# Patient Record
Sex: Female | Born: 1935 | Race: White | Hispanic: No | Smoking: Never smoker
Health system: Southern US, Community
[De-identification: ages and names within clinical notes are randomized; demographics above are authoritative.]

## PROBLEM LIST (undated history)

## (undated) DIAGNOSIS — M1990 Unspecified osteoarthritis, unspecified site: Secondary | ICD-10-CM

## (undated) DIAGNOSIS — K219 Gastro-esophageal reflux disease without esophagitis: Secondary | ICD-10-CM

## (undated) DIAGNOSIS — I509 Heart failure, unspecified: Secondary | ICD-10-CM

## (undated) DIAGNOSIS — I351 Nonrheumatic aortic (valve) insufficiency: Secondary | ICD-10-CM

## (undated) DIAGNOSIS — R29898 Other symptoms and signs involving the musculoskeletal system: Secondary | ICD-10-CM

## (undated) DIAGNOSIS — I10 Essential (primary) hypertension: Secondary | ICD-10-CM

## (undated) DIAGNOSIS — F32A Depression, unspecified: Secondary | ICD-10-CM

## (undated) DIAGNOSIS — D649 Anemia, unspecified: Secondary | ICD-10-CM

## (undated) DIAGNOSIS — F419 Anxiety disorder, unspecified: Secondary | ICD-10-CM

## (undated) DIAGNOSIS — F0150 Vascular dementia without behavioral disturbance: Secondary | ICD-10-CM

## (undated) DIAGNOSIS — I5021 Acute systolic (congestive) heart failure: Secondary | ICD-10-CM

## (undated) DIAGNOSIS — R0902 Hypoxemia: Secondary | ICD-10-CM

## (undated) DIAGNOSIS — F329 Major depressive disorder, single episode, unspecified: Secondary | ICD-10-CM

## (undated) DIAGNOSIS — G459 Transient cerebral ischemic attack, unspecified: Secondary | ICD-10-CM

## (undated) DIAGNOSIS — Z8719 Personal history of other diseases of the digestive system: Secondary | ICD-10-CM

## (undated) DIAGNOSIS — R410 Disorientation, unspecified: Secondary | ICD-10-CM

## (undated) HISTORY — DX: Essential (primary) hypertension: I10

## (undated) HISTORY — DX: Disorientation, unspecified: R41.0

## (undated) HISTORY — DX: Anxiety disorder, unspecified: F41.9

## (undated) HISTORY — DX: Hypoxemia: R09.02

## (undated) HISTORY — DX: Acute systolic (congestive) heart failure: I50.21

## (undated) HISTORY — DX: Depression, unspecified: F32.A

## (undated) HISTORY — DX: Major depressive disorder, single episode, unspecified: F32.9

## (undated) HISTORY — DX: Other symptoms and signs involving the musculoskeletal system: R29.898

## (undated) HISTORY — DX: Anemia, unspecified: D64.9

## (undated) HISTORY — DX: Unspecified osteoarthritis, unspecified site: M19.90

## (undated) HISTORY — DX: Transient cerebral ischemic attack, unspecified: G45.9

## (undated) HISTORY — DX: Nonrheumatic aortic (valve) insufficiency: I35.1

---

## 1960-07-04 HISTORY — PX: TUBAL LIGATION: SHX77

## 1979-03-05 HISTORY — PX: CARPAL TUNNEL RELEASE: SHX101

## 1989-03-04 HISTORY — PX: CHOLECYSTECTOMY: SHX55

## 1989-03-04 HISTORY — PX: CATARACT EXTRACTION W/ INTRAOCULAR LENS  IMPLANT, BILATERAL: SHX1307

## 1999-07-05 HISTORY — PX: TOTAL KNEE ARTHROPLASTY: SHX125

## 1999-12-01 ENCOUNTER — Encounter: Payer: Self-pay | Admitting: Orthopedic Surgery

## 1999-12-02 ENCOUNTER — Inpatient Hospital Stay (HOSPITAL_COMMUNITY): Admission: RE | Admit: 1999-12-02 | Discharge: 1999-12-06 | Payer: Self-pay | Admitting: Orthopedic Surgery

## 1999-12-06 ENCOUNTER — Inpatient Hospital Stay (HOSPITAL_COMMUNITY)
Admission: RE | Admit: 1999-12-06 | Discharge: 1999-12-14 | Payer: Self-pay | Admitting: Physical Medicine & Rehabilitation

## 2000-05-26 ENCOUNTER — Inpatient Hospital Stay (HOSPITAL_COMMUNITY): Admission: EM | Admit: 2000-05-26 | Discharge: 2000-05-30 | Payer: Self-pay | Admitting: Emergency Medicine

## 2000-05-26 ENCOUNTER — Encounter: Payer: Self-pay | Admitting: Emergency Medicine

## 2000-05-27 ENCOUNTER — Encounter: Payer: Self-pay | Admitting: Internal Medicine

## 2002-10-18 ENCOUNTER — Ambulatory Visit (HOSPITAL_COMMUNITY): Admission: RE | Admit: 2002-10-18 | Discharge: 2002-10-18 | Payer: Self-pay | Admitting: Gastroenterology

## 2002-10-22 ENCOUNTER — Encounter: Payer: Self-pay | Admitting: Gastroenterology

## 2002-10-22 ENCOUNTER — Ambulatory Visit (HOSPITAL_COMMUNITY): Admission: RE | Admit: 2002-10-22 | Discharge: 2002-10-22 | Payer: Self-pay | Admitting: Gastroenterology

## 2006-05-30 ENCOUNTER — Encounter: Admission: RE | Admit: 2006-05-30 | Discharge: 2006-05-30 | Payer: Self-pay | Admitting: Internal Medicine

## 2007-06-08 ENCOUNTER — Encounter: Admission: RE | Admit: 2007-06-08 | Discharge: 2007-07-04 | Payer: Self-pay | Admitting: Neurology

## 2008-09-20 ENCOUNTER — Inpatient Hospital Stay (HOSPITAL_COMMUNITY): Admission: EM | Admit: 2008-09-20 | Discharge: 2008-09-26 | Payer: Self-pay | Admitting: Emergency Medicine

## 2008-11-21 ENCOUNTER — Encounter: Admission: RE | Admit: 2008-11-21 | Discharge: 2008-11-21 | Payer: Self-pay | Admitting: Internal Medicine

## 2009-04-28 ENCOUNTER — Encounter: Admission: RE | Admit: 2009-04-28 | Discharge: 2009-04-28 | Payer: Self-pay | Admitting: Internal Medicine

## 2010-07-04 DIAGNOSIS — I509 Heart failure, unspecified: Secondary | ICD-10-CM

## 2010-07-04 HISTORY — DX: Heart failure, unspecified: I50.9

## 2010-08-11 ENCOUNTER — Inpatient Hospital Stay (HOSPITAL_COMMUNITY): Payer: Medicare Other

## 2010-08-11 ENCOUNTER — Inpatient Hospital Stay (HOSPITAL_COMMUNITY)
Admission: AD | Admit: 2010-08-11 | Discharge: 2010-08-14 | DRG: 293 | Disposition: A | Discharge status: Home Health | Payer: Medicare Other | Source: Ambulatory Visit | Attending: Internal Medicine | Admitting: Internal Medicine

## 2010-08-11 ENCOUNTER — Encounter: Payer: Self-pay | Admitting: Internal Medicine

## 2010-08-11 DIAGNOSIS — Z7982 Long term (current) use of aspirin: Secondary | ICD-10-CM

## 2010-08-11 DIAGNOSIS — R404 Transient alteration of awareness: Secondary | ICD-10-CM | POA: Diagnosis present

## 2010-08-11 DIAGNOSIS — I319 Disease of pericardium, unspecified: Secondary | ICD-10-CM

## 2010-08-11 DIAGNOSIS — F341 Dysthymic disorder: Secondary | ICD-10-CM | POA: Diagnosis present

## 2010-08-11 DIAGNOSIS — I10 Essential (primary) hypertension: Secondary | ICD-10-CM | POA: Diagnosis present

## 2010-08-11 DIAGNOSIS — Z66 Do not resuscitate: Secondary | ICD-10-CM | POA: Diagnosis present

## 2010-08-11 DIAGNOSIS — R0902 Hypoxemia: Secondary | ICD-10-CM | POA: Diagnosis present

## 2010-08-11 DIAGNOSIS — I509 Heart failure, unspecified: Secondary | ICD-10-CM | POA: Diagnosis present

## 2010-08-11 DIAGNOSIS — F068 Other specified mental disorders due to known physiological condition: Secondary | ICD-10-CM | POA: Diagnosis present

## 2010-08-11 DIAGNOSIS — Z8673 Personal history of transient ischemic attack (TIA), and cerebral infarction without residual deficits: Secondary | ICD-10-CM

## 2010-08-11 DIAGNOSIS — I359 Nonrheumatic aortic valve disorder, unspecified: Secondary | ICD-10-CM | POA: Diagnosis present

## 2010-08-11 DIAGNOSIS — R5381 Other malaise: Secondary | ICD-10-CM | POA: Diagnosis present

## 2010-08-11 DIAGNOSIS — I5023 Acute on chronic systolic (congestive) heart failure: Principal | ICD-10-CM | POA: Diagnosis present

## 2010-08-11 LAB — CBC
HCT: 38 % (ref 36.0–46.0)
MCV: 88.4 fL (ref 78.0–100.0)
Platelets: 284 10*3/uL (ref 150–400)
RBC: 4.3 MIL/uL (ref 3.87–5.11)
WBC: 7.6 10*3/uL (ref 4.0–10.5)

## 2010-08-11 LAB — COMPREHENSIVE METABOLIC PANEL
ALT: 10 U/L (ref 0–35)
Albumin: 3.3 g/dL — ABNORMAL LOW (ref 3.5–5.2)
Alkaline Phosphatase: 39 U/L (ref 39–117)
BUN: 10 mg/dL (ref 6–23)
Chloride: 104 mEq/L (ref 96–112)
Potassium: 3.6 mEq/L (ref 3.5–5.1)
Total Bilirubin: 0.6 mg/dL (ref 0.3–1.2)

## 2010-08-11 LAB — DIFFERENTIAL
Eosinophils Absolute: 0.1 10*3/uL (ref 0.0–0.7)
Lymphocytes Relative: 14 % (ref 12–46)
Lymphs Abs: 1 10*3/uL (ref 0.7–4.0)
Neutrophils Relative %: 78 % — ABNORMAL HIGH (ref 43–77)

## 2010-08-11 LAB — D-DIMER, QUANTITATIVE: D-Dimer, Quant: 0.48 ug/mL-FEU (ref 0.00–0.48)

## 2010-08-12 DIAGNOSIS — I509 Heart failure, unspecified: Secondary | ICD-10-CM

## 2010-08-12 LAB — CBC
HCT: 38.5 % (ref 36.0–46.0)
MCH: 29.2 pg (ref 26.0–34.0)
MCHC: 33.2 g/dL (ref 30.0–36.0)
MCV: 87.9 fL (ref 78.0–100.0)
RDW: 12.9 % (ref 11.5–15.5)

## 2010-08-12 LAB — BASIC METABOLIC PANEL
BUN: 12 mg/dL (ref 6–23)
Calcium: 8.6 mg/dL (ref 8.4–10.5)
Creatinine, Ser: 0.61 mg/dL (ref 0.4–1.2)
GFR calc non Af Amer: >60 mL/min (ref >60)
Glucose, Bld: 93 mg/dL (ref 70–99)

## 2010-08-12 LAB — T4, FREE: Free T4: 1.16 ng/dL (ref 0.80–1.80)

## 2010-08-13 ENCOUNTER — Inpatient Hospital Stay (HOSPITAL_COMMUNITY): Payer: Medicare Other

## 2010-08-13 LAB — CBC
HCT: 39.7 % (ref 36.0–46.0)
MCV: 87.6 fL (ref 78.0–100.0)
RBC: 4.53 MIL/uL (ref 3.87–5.11)
WBC: 9.9 10*3/uL (ref 4.0–10.5)

## 2010-08-13 LAB — BASIC METABOLIC PANEL
BUN: 13 mg/dL (ref 6–23)
Chloride: 103 mEq/L (ref 96–112)
Glucose, Bld: 106 mg/dL — ABNORMAL HIGH (ref 70–99)
Potassium: 3.6 mEq/L (ref 3.5–5.1)
Sodium: 142 mEq/L (ref 135–145)

## 2010-08-13 LAB — MAGNESIUM: Magnesium: 2 mg/dL (ref 1.5–2.5)

## 2010-08-14 LAB — BRAIN NATRIURETIC PEPTIDE: Pro B Natriuretic peptide (BNP): 330 pg/mL — ABNORMAL HIGH (ref 0.0–100.0)

## 2010-08-14 LAB — BASIC METABOLIC PANEL
CO2: 31 mEq/L (ref 19–32)
GFR calc Af Amer: >60 mL/min (ref >60)
GFR calc non Af Amer: >60 mL/min (ref >60)
Glucose, Bld: 97 mg/dL (ref 70–99)
Potassium: 3.3 mEq/L — ABNORMAL LOW (ref 3.5–5.1)
Sodium: 144 mEq/L (ref 135–145)

## 2010-08-14 LAB — PHOSPHORUS: Phosphorus: 4.5 mg/dL (ref 2.3–4.6)

## 2010-08-15 NOTE — H&P (Signed)
[name], [name]                   ACCOUNT NO.:  [id number]  MEDICAL RECORD NO.:  [id number]           PATIENT TYPE:  I  LOCATION:  4728                         FACILITY:  [facility]  PHYSICIAN:  Andreas Blower, MD       DATE OF BIRTH:  09-04-1925  DATE OF ADMISSION:  08/11/2010 DATE OF DISCHARGE:                             HISTORY & PHYSICAL   PRIMARY CARE PHYSICIAN:  Erskine Speed, M.D.  CHIEF COMPLAINT:  Shortness of breath.  HISTORY OF PRESENT ILLNESS:  [name] is an 77 year old Caucasian female with history of hypertension, dementia, anxiety, depression, TIA's, deconditioning, hiatal hernia who presents with the above complaints. Given the patient's significant dementia, most of the history was provided by the patient's family and caretaker.  The patient has had intermittent episodes of shortness of breath.  However, was worse over the weekend on February 4 and 5th of 2012.  About a week ago, they had gone to her primary care physician who again noted that the patient was not acting her usual self.  The patient had gone back to her primary care physician for a followup appointment today and it was noted that she continued to be short of breath and continued to have confusion with new grade 2/6 systolic murmur.  As the result, her primary care physician had the patient directly admitted for further evaluation.  Per the patient's caretaker, has not had any recent fevers, but does feel chills all the time.  The patient does not complain of any chest pain or shortness of breath presently, does not complain of any abdominal pain. Does have loose stools which are normal for her.  Does not complain of any headaches or vision changes.  She also with the shortness of breath has been having cough which has been nonproductive.  REVIEW OF SYSTEMS:  All systems were reviewed with the patient, was positive as per HPI.  Otherwise all other systems are negative.  Limited due to the patient's  dementia.  SOCIAL HISTORY:  The patient does not smoke, does not drink any alcohol. Lives at home with the daughter.  Caretaker looks after her during the days.  FAMILY HISTORY:  The patient is not able to provide any family history and the family members that are present are not able to provide any family history except the patient's parents and siblings lived for a long time and died of natural causes.  HOME MEDICATIONS: 1. Acetaminophen 1000 mg p.o. daily at bedtime. 2. Bupropion sustained release 150 mg p.o. daily. 3. Donepezil 20 mg p.o. daily at bedtime. 4. Tramadol 25 mg p.o. q.a.m. 5. Omeprazole 20 mg p.o. q.a.m. 6. Fluoxetine 40 mg p.o. daily. 7. Clonazepam 0.25 mg p.o. daily 8. Metoprolol extended release 12.5 mg p.o. daily.  PHYSICAL EXAMINATION:  VITAL SIGNS:  Temperature 97.4, pulse is 72, respirations 16, blood pressure is 164/72, saturating at 90%. GENERAL:  The patient was awake, was oriented to self, was lying in bed comfortably.  Not appeared to be in any acute distress.  Was breathing comfortably on room air, was able to talk to me  in complete sentences without getting short of breath. HEENT:  Extraocular motions are intact.  Pupils are equal and round. Had moist mucous membranes. NECK:  Supple. HEART:  Regular with S1 and S2.  Had a grade 2/6 systolic murmur at the left sternal border. ABDOMEN:  Soft, nontender, nondistended.  Positive bowel sounds. EXTREMITIES:  The patient had good peripheral pulses with trace edema. NEUROLOGIC:  Cranial nerves II through XII are grossly intact.  Had 5/5 motor strength in upper as well as lower extremities.  RADIOLOGY/IMAGING:  The patient had chest x-ray two-view which showed bibasilar pulmonary infiltrates which could represent pulmonary edema. Huge hiatal hernia.  LABORATORY DATA:  CBC shows a white count of 7.6, hemoglobin 12.5, hematocrit 38.0, platelet count 284.  Electrolytes normal with a creatinine of 0.57.   Liver function tests normal except albumin is 3.3.  ASSESSMENT AND PLAN: 1. Shortness of breath, multifactorial.  The patient has a pulmonary     edema noted on chest x-ray with deconditioning.  A 2-D     echocardiogram is currently being done, the results are pending.     Given that the patient does have pulmonary edema, we have started     the patient on gentle diuresis on Lasix 20 mg IV x2 doses today,     then twice daily starting tomorrow.  Further management after the 2-     D echocardiogram results are available.  D-dimer has been ordered     and pending, if positive will need a CT of the chest with contrast     to evaluate for pulmonary embolism.  Given that the patient has     infiltrates which may be due to pulmonary edema, we will also     empirically start the patient on ceftriaxone and azithromycin for     cover for any community-acquired pneumonia.  Would consider getting     a chest x-ray in 1-2 days.  If chest x-ray shows improvement in     pulmonary edema on Lasix without any a fever to suggest pneumonia,     would consider discontinuing antibiotics at that time. 2. Pulmonary edema.  Management as indicated above.  2-D  echocardiogram is pending.  We will check for BNP. 3. Grade 2/6 systolic murmur.  2-D echocardiogram is done, the results     are pending.  Further management after the echocardiogram results     are available. 4. Hypertension.  Continue home medications of metoprolol with hold     parameters. 5. Acute delirium on top of dementia most likely secondary to     shortness of breath, pulmonary edema.  We will observe how the     patient does during the course of hospital stay. 6. History of anxiety and depression.  Continue home medications.  We     will change clonazepam to p.r.n.  I will continue bupropion     sustained release and fluoxetine. 7. History of deconditioning.  We will get PT evaluation. 8. Hypoxia most likely secondary to pulmonary edema.   D-dimer is     pending.  If possible we will CT of the chest with contrast to     evaluate for PE. 9. Prophylaxis.  Lovenox for DVT prophylaxis. 10.Code status.  The patient is full code.  Most of the decisions are     made by the patient's daughter is not available     at this time.  The patient's other family members are uncertain  about the code status issues. Once the patient's daughter is     available, we will need to have that readdressed with the daughter.  Time spent on admission talking to the patient coordinating care was 1 hour.   Andreas Blower, MD   SR/MEDQ  D:  08/11/2010  T:  08/11/2010  Job:  [id number]  cc:   Erskine Speed, M.D.  Electronically Signed by Wardell Heath Nicloe Frontera  on 08/13/2010 08:22:30 PM

## 2010-08-15 NOTE — Discharge Summary (Signed)
[name], HOLLINGSHEAD                   ACCOUNT NO.:  [id number]  MEDICAL RECORD NO.:  [id number]           PATIENT TYPE:  I  LOCATION:  4728                         FACILITY:  [facility]  PHYSICIAN:  Rock Nephew, MD       DATE OF BIRTH:  13-Feb-1926  DATE OF ADMISSION:  08/11/2010 DATE OF DISCHARGE:  08/14/2010                        DISCHARGE SUMMARY - REFERRING   PRIMARY CARE PHYSICIAN:  Erskine Speed, M.D.  DISCHARGE DIAGNOSES: 1. Acute systolic heart failure. 2. Aortic regurgitation. 3. Hypertension. 4. Acute delirium. 5. Dementia. 6. Anxiety. 7. Depression. 8. Deconditioning. 9. Hypoxia, resolved. 10.History of transient ischemic attack.  DISCHARGE MEDICATIONS: 1. K-Dur 20 mEq p.o. daily. 2. Lasix 40 mg p.o. daily. 3. Lisinopril 10 mg p.o. daily. 4. Acetaminophen 500 mg 2 tablets by mouth daily at bedtime. 5. Aricept 10 mg 2 tablets by mouth daily at bedtime. 6. Bupropion SR 150 mg 1 tablet by mouth daily. 7. Clonazepam 0.5 mg 1/2 tablet by mouth daily. 8. Metoprolol XL 25 mg 1/2 tablet by mouth daily. 9. Omeprazole 20 mg 1 tablet by mouth every morning. 10.Prozac 20 mg 2 tablets by mouth daily. 11.Tramadol 50 mg 1/2 tablet by mouth every morning. 12.Aspirin 81 mg p.o. daily.  DISPOSITION:  The patient is discharged home with caregiver as well as family.  DIET:  Heart-healthy 1.5 liters fluid restriction.  CONSULTATIONS ON THIS CASE:  Dr. Duke Salvia, [facility].  PROCEDURES PERFORMED:  The patient had chest x-ray, the last one was on August 13, 2010, which showed improvement in aeration at lung base compared to prior, persistent left lower atelectasis, and/or effusion, cardiomegaly.  The patient also had a 2-D echocardiogram, which showed a left ventricular ejection fraction of 40-45%, moderate hypokinesis of the mid anteroseptal myocardium, aortic valve, moderate regurgitation. Pulmonary artery pressure was increased at 49 mmHg, mild mitral  regurg, small free-flowing pericardial effusion.  FOLLOWUP AGAIN:  The patient should follow up with Dr. Nila Nephew within 1 week.  The patient should follow up with Dr. Sherryl Manges of [facility] in about 2-4 weeks.  BRIEF HISTORY AND PHYSICAL:  This is an 77 year old female with history of multiple medical problems, who came to the hospital because of shortness of breath.  The patient also has a heart murmur, that is newly discovered.  The patient came to the hospital, and the patient had pulmonary vascular congestion on the chest x-ray and an elevated BNP with symptoms consistent of heart failure.  1. Acute systolic heart failure.  The patient was admitted to the     hospital.  The patient was diuresed with IV Lasix.  The patient had     a 2-D echocardiogram done, which showed a left ventricle ejection     fraction of 45% with hypokinesias.  [facility] was consulted and     Cardiology thought the best to manage the patient medically.  The     patient is not a clear candidate for a cardiac catheterization.     Cardiac catheterization was not offered to the patient.  The     patient will be  discharged home on Lasix and an ACE inhibitor. 2. Aortic regurgitation.  The patient has aortic regurgitation, and     she is being monitored. 3. Hypertension.  The patient has a history hypertension.  Now, the     patient's blood pressure has been controlled. 4. Acute delirium and dementia.  The patient's acute delirium and     dementia has resolved.  The patient is at her baseline mental     status.  She takes Aricept at home. 5. Anxiety and depression.  The patient has anxiety and depression.     She takes clonazepam at home and which she will be continued. 6. Deconditioning.  The patient will get a home health PT at home.     The patient also has a caregiver at home which will continue. 7. Hypoxia.  The patient had hypoxia initially.  It was most likely     related to the  patient's systolic congestive heart failure, which     has since resolved. 8. DVT prophylaxis.  The patient is receiving enoxaparin for DVT     prophylaxis during the discharge.  Also of note, we discussed code status with the patient's daughter, and the patient was made DNR during this admission.   Please note that this is not an official document till electronically signed.     Rock Nephew, MD     NH/MEDQ  D:  08/14/2010  T:  08/14/2010  Job:  [id number]  cc:   Erskine Speed, M.D. Duke Salvia, MD, [facility]  Electronically Signed by Rock Nephew MD on 08/15/2010 04:54:09 PM

## 2010-08-15 NOTE — H&P (Signed)
  [name], [name]                   ACCOUNT NO.:  [id number]  MEDICAL RECORD NO.:  [id number]           PATIENT TYPE:  I  LOCATION:  4728                         FACILITY:  [facility]  PHYSICIAN:  Andreas Blower, MD       DATE OF BIRTH:  02/01/1926  DATE OF ADMISSION:  08/11/2010 DATE OF DISCHARGE:  Duplicated H&P dictated in error.  Andreas Blower, MD  SR/MEDQ  D:  08/11/2010  T:  08/12/2010  Job:  [id number] Electronically Signed by Wardell Heath [name]  on 08/13/2010 08:24:04 PM

## 2010-08-23 NOTE — Consult Note (Signed)
NAMETIONNA, GIGANTE                   ACCOUNT NO.:  [id number]  MEDICAL RECORD NO.:  [id number]           PATIENT TYPE:  LOCATION:                                 FACILITY:  PHYSICIAN:  Duke Salvia, MD, FACCDATE OF BIRTH:  [birth year]  DATE OF CONSULTATION:  08/12/2010 DATE OF DISCHARGE:                                CONSULTATION   PRIMARY CARDIOLOGIST:  New patient to [facility], currently being evaluated by Dr. Sherryl Manges.  PRIMARY CARE PROVIDER:  Erskine Speed, MD  REASON FOR CONSULTATION:  Congestive heart failure.  HISTORY OF PRESENT ILLNESS:  This is an 77 year old elderly female who is a poor historian secondary to advanced dementia.  History was provided by the patient's daughter and caretaker.  The patient's daughter states that she noticed the patient becoming weaker and more confused approximately 48 hours prior to admission.  Since that time, she has had increased shortness of breath.  On day of admission, the caretaker went to awake the patient and noted that she was orthopneic with a nonproductive cough.  They brought the patient to her primary care provider where a new murmur was heard.  With the patient's dyspnea, as well as new heart murmur, the patient was admitted to the hospital for further evaluation.    The patient was admitted by the triad hospitalist group and placed on IV diuresis.  The patient's initial weight was 55.4 kg, and she is currently 53.6 kg.  Her BNP was elevated to 862.  Chest x-ray did show probable edema.  A 2-D echocardiogram has since been obtained and showed a slightly decreased ejection fraction of 45-50% with moderate hypokinesis of the mid anterior septal myocardium.  It also showed moderate aortic insufficiency.  Cardiology was asked to evaluate the patient for medication optimization.  The patient currently states that her breathing is fine.  The caretaker and the daughter, Ms. Manson Passey, state that the patient's  shortness of breath has improved.  They deny any complaints of chest pain, edema, fevers, or chills.  The patient is currently hemodynamically stable.  PAST MEDICAL HISTORY: 1. Hypertension. 2. Dementia. 3. Anxiety. 4. Depression. 5. TIA. 6. Gastroesophageal reflux disease. 7. Hiatal hernia. 8. Status post cholecystectomy. 9. Status post left knee surgery.  SOCIAL HISTORY:  The patient lives in [place] with her daughter. While her daughter is at work, she has a caretaker that looks after her. The patient denies any tobacco, alcohol, or illicit drug use.  FAMILY HISTORY:  Noncontributory for early coronary artery disease.  The patient states that her family lived long and died of old age.  ALLERGIES: 1. DILANTIN. 2. TEGRETOL.  MEDICATIONS: 1. Aspirin 81 mg daily. 2. Wellbutrin 150 mg daily. 3. Aricept 20 mg daily. 4. Lovenox 40 mg subcutaneously daily. 5. Prozac 40 mg daily. 6. Lasix 40 mg IV twice daily. 7. Lisinopril 2.5 mg daily. 8. Toprol 12.5 mg daily. 9. Protonix 40 mg daily. 10.Ultram 25 mg daily.  REVIEW OF SYSTEMS:  All pertinent positives and negatives as stated in HPI.  The patient also denies nausea, vomiting, or diarrhea.  All  other systems have been reviewed and are negative.  PHYSICAL EXAMINATION:  VITAL SIGNS:  Temperature 98 degrees, pulse 77, respirations 18, blood pressure 156-172 over 70-82, O2 saturation 96% on 2 liters.  Ins and outs 540 mL.  Weight currently 53.6 (on admission 55.4). GENERAL:  This is a polite well-appearing elderly female.  She is in no acute distress. HEENT:  Normal. NECK:  Supple with minimal JVD. HEART:  Regular rate and rhythm with S1 and S2.  There is a 2/6 diastolic murmur at the left upper sternal border.  Pulses are 2+ and equal bilaterally. LUNGS:  The patient moves air efficiently bilaterally with an occasional crackle. ABDOMEN:  Soft, nontender, positive bowel sounds x4. EXTREMITIES:  No clubbing or  cyanosis. SKIN:  Warm to touch.  Trace bilateral lower extremity edema. MUSCULOSKELETAL:  No joint deformities or effusions. NEUROLOGIC:  The patient is alert and oriented to self.  Cranial nerves II through XII are grossly intact.  Chest x-ray showing bibasilar pulmonary infiltrates, this could represent pulmonary edema. EKG showing normal sinus rhythm at a rate of 74 beats per minute.  There is left axis deviation.  There are nonspecific T-wave changes.  LABORATORY DATA:  WBC of 8.8, hemoglobin 12.8, hematocrit 38.5, platelets 287.  Sodium 140, potassium 3, chloride 101, bicarb 28, BUN 12, creatinine 0.61.  D-dimer 0.48.  BNP 862.  Troponin negative x2.  IMPRESSION: 1. Heart failure with preserved ejection fraction, diastolic.     Ejection fraction 45-50%. 2. Mild to moderate aortic insufficiency without left ventricular     dilatation. 3. Hypertension, poorly controlled. 4. Dementia.  RECOMMENDATIONS:  The patient's aortic insufficiency is of moderate significance.  The patient's hypertension is likely contributing to the patient's heart failure.  We would recommend continued diuresis and gradual reduction of blood pressure as the patient has history of TIAs. Given the patient's comorbidities, no further evaluation is indicated at this time.  We would agree with continuing lisinopril.  Thank you for this evaluation, please call for any further questions.  I have spoken with the patient's daughter, and she would like to make her mother a DNR at this time.  This has been discussed with the hospitalist service.     Leonette Monarch, PA-C   ______________________________ Duke Salvia, MD, [facility]    NB/MEDQ  D:  08/12/2010  T:  08/13/2010  Job:  [id number]  Electronically Signed by Alen Blew P.A. on 08/20/2010 12:58:47 PM Electronically Signed by Sherryl Manges MD F[facility] on 08/23/2010 10:01:05 PM

## 2010-09-09 NOTE — Letter (Signed)
Summary: [facility]: Discharge Summary  [facility]: Discharge Summary   Imported By: Earl Many 08/26/2010 13:22:28  _____________________________________________________________________  External Attachment:    Type:   Image     Comment:   External Document

## 2010-09-10 ENCOUNTER — Encounter (INDEPENDENT_AMBULATORY_CARE_PROVIDER_SITE_OTHER): Payer: Self-pay | Admitting: *Deleted

## 2010-09-14 NOTE — Letter (Signed)
Summary: Appointment - Reschedule  [facility], Main Office  [address]   [place], [place] [address]   Phone: [phone]  Fax: [phone]     September 10, 2010 MRN: [id number]   [facility] [address] [place], [place]  [address]   Dear Ms. Madewell,   Due to a change in our office schedule, your appointment on  10-01-2010                     at  10:35 a.m.      must be changed.  It is very important that we reach you to reschedule this appointment. We look forward to participating in your health care needs. Please contact us at the number listed above at your earliest convenience to reschedule this appointment.     Sincerely,      Lorne Skeens  [facility] Scheduling Team

## 2010-10-01 ENCOUNTER — Encounter: Payer: Medicare Other | Admitting: Internal Medicine

## 2010-10-14 LAB — BASIC METABOLIC PANEL
BUN: 7 mg/dL (ref 6–23)
BUN: 8 mg/dL (ref 6–23)
CO2: 21 mEq/L (ref 19–32)
CO2: 22 mEq/L (ref 19–32)
CO2: 24 mEq/L (ref 19–32)
Calcium: 7.9 mg/dL — ABNORMAL LOW (ref 8.4–10.5)
Chloride: 106 mEq/L (ref 96–112)
Chloride: 107 mEq/L (ref 96–112)
Chloride: 117 mEq/L — ABNORMAL HIGH (ref 96–112)
Creatinine, Ser: 0.49 mg/dL (ref 0.4–1.2)
Creatinine, Ser: 0.49 mg/dL (ref 0.4–1.2)
Creatinine, Ser: 0.52 mg/dL (ref 0.4–1.2)
GFR calc Af Amer: >60 mL/min (ref >60)
GFR calc Af Amer: >60 mL/min (ref >60)
Potassium: 2.2 mEq/L — CL (ref 3.5–5.1)
Potassium: 4 mEq/L (ref 3.5–5.1)
Sodium: 140 mEq/L (ref 135–145)

## 2010-10-14 LAB — COMPREHENSIVE METABOLIC PANEL
ALT: 28 U/L (ref 0–35)
AST: 39 U/L — ABNORMAL HIGH (ref 0–37)
Albumin: 3.8 g/dL (ref 3.5–5.2)
Alkaline Phosphatase: 57 U/L (ref 39–117)
Calcium: 9 mg/dL (ref 8.4–10.5)
GFR calc Af Amer: >60 mL/min (ref >60)
GFR calc non Af Amer: >60 mL/min (ref >60)
Glucose, Bld: 99 mg/dL (ref 70–99)
Potassium: 2.3 mEq/L — CL (ref 3.5–5.1)
Sodium: 139 mEq/L (ref 135–145)
Total Protein: 6.9 g/dL (ref 6.0–8.3)

## 2010-10-14 LAB — URINE MICROSCOPIC-ADD ON

## 2010-10-14 LAB — URINALYSIS, ROUTINE W REFLEX MICROSCOPIC
Ketones, ur: NEGATIVE mg/dL
Nitrite: NEGATIVE
Protein, ur: NEGATIVE mg/dL
Urobilinogen, UA: 0.2 mg/dL (ref 0.0–1.0)

## 2010-10-14 LAB — OVA AND PARASITE EXAMINATION: Ova and parasites: NONE SEEN

## 2010-10-14 LAB — CBC
HCT: 34.8 % — ABNORMAL LOW (ref 36.0–46.0)
MCHC: 34.1 g/dL (ref 30.0–36.0)
MCHC: 34.9 g/dL (ref 30.0–36.0)
MCV: 88.3 fL (ref 78.0–100.0)
Platelets: 288 10*3/uL (ref 150–400)
Platelets: 298 10*3/uL (ref 150–400)
RDW: 13.3 % (ref 11.5–15.5)
WBC: 7.1 10*3/uL (ref 4.0–10.5)

## 2010-10-14 LAB — CLOSTRIDIUM DIFFICILE EIA: C difficile Toxins A+B, EIA: NEGATIVE

## 2010-10-14 LAB — STOOL CULTURE

## 2010-10-14 LAB — DIFFERENTIAL
Basophils Absolute: 0 10*3/uL (ref 0.0–0.1)
Basophils Relative: 0 % (ref 0–1)
Eosinophils Relative: 1 % (ref 0–5)
Lymphocytes Relative: 17 % (ref 12–46)
Neutro Abs: 4.4 10*3/uL (ref 1.7–7.7)

## 2010-10-14 LAB — POTASSIUM: Potassium: 3.5 mEq/L (ref 3.5–5.1)

## 2010-10-14 LAB — URINE CULTURE: Colony Count: >=100000

## 2010-10-21 ENCOUNTER — Encounter: Payer: Self-pay | Admitting: Internal Medicine

## 2010-10-22 ENCOUNTER — Ambulatory Visit (INDEPENDENT_AMBULATORY_CARE_PROVIDER_SITE_OTHER): Payer: Medicare Other | Admitting: Internal Medicine

## 2010-10-22 ENCOUNTER — Encounter: Payer: Self-pay | Admitting: Internal Medicine

## 2010-10-22 DIAGNOSIS — I10 Essential (primary) hypertension: Secondary | ICD-10-CM

## 2010-10-22 DIAGNOSIS — F0280 Dementia in other diseases classified elsewhere without behavioral disturbance: Secondary | ICD-10-CM | POA: Insufficient documentation

## 2010-10-22 DIAGNOSIS — F0390 Unspecified dementia without behavioral disturbance: Secondary | ICD-10-CM

## 2010-10-22 DIAGNOSIS — I359 Nonrheumatic aortic valve disorder, unspecified: Secondary | ICD-10-CM

## 2010-10-22 DIAGNOSIS — I351 Nonrheumatic aortic (valve) insufficiency: Secondary | ICD-10-CM | POA: Insufficient documentation

## 2010-10-22 DIAGNOSIS — I5032 Chronic diastolic (congestive) heart failure: Secondary | ICD-10-CM

## 2010-10-22 DIAGNOSIS — I509 Heart failure, unspecified: Secondary | ICD-10-CM

## 2010-10-22 NOTE — Progress Notes (Signed)
  HPI  [name] is a 77 y.o. female Seen in followup for hospitalization in February for heart failure in the setting of mild LV dysfunction with an EF of 45-50% and moderate aortic insufficiency. She was treated with a diuretic and is a relatively well. There has been full resolution of her edema. Her dyspnea while persistent and more notable with things like bathing much improved. The patient is nonambulatory at baseline.  Past Medical History  Diagnosis Date  . Acute systolic heart failure   . Aortic regurgitation   . Hypertension   . Delirium, acute   . Dementia   . Anxiety   . Depression   . Muscular deconditioning   . Hypoxia     resolved  . TIA (transient ischemic attack)     history  . Anemia     history  . Degenerative joint disease     history    Past Surgical History  Procedure Date  . Total knee arthroplasty 2001    left  . Cholecystectomy     Current Outpatient Prescriptions  Medication Sig Dispense Refill  . acetaminophen (TYLENOL) 500 MG tablet Take 1,000 mg by mouth every 6 (six) hours as needed.       Marland Kitchen aspirin 81 MG tablet Take 81 mg by mouth daily.        Marland Kitchen buPROPion (WELLBUTRIN SR) 150 MG 12 hr tablet Take 150 mg by mouth daily.        . clonazePAM (KLONOPIN) 0.5 MG tablet Take 0.5 mg by mouth daily as needed.       . donepezil (ARICEPT) 10 MG tablet Take by mouth at bedtime as needed. 2 tablets       . FLUoxetine (PROZAC) 20 MG capsule Take 40 mg by mouth daily.        . furosemide (LASIX) 40 MG tablet Take 40 mg by mouth daily.       Marland Kitchen lisinopril (PRINIVIL,ZESTRIL) 10 MG tablet Take 10 mg by mouth daily.        . metoprolol succinate (TOPROL-XL) 25 MG 24 hr tablet Take 25 mg by mouth daily. 1/2 tablet       . omeprazole (PRILOSEC) 20 MG capsule Take 20 mg by mouth daily.        . potassium chloride SA (K-DUR,KLOR-CON) 20 MEQ tablet Take 20 mEq by mouth daily.        . traMADol (ULTRAM) 50 MG tablet Take 50 mg by mouth every 6 (six) hours as needed.         . traZODone (DESYREL) 50 MG tablet 1 tablet.        Allergies  Allergen Reactions  . Dilantin Itching  . Tegretol (Carbamazepine) Itching    itching    Review of Systems negative except from HPI and PMH  Physical Exam Well developed and well nourished in no acute distress HENT normal E scleral and icterus clear Neck Supple JVP 7-8 cm  carotids brisk and full Clear to ausculation 2 6 murmurs her along the left sternal border Soft with active bowel sounds No clubbing cyanosis and edema Alert; and oriented x1, grossly normal motor and sensory function Skin Warm and Dry  ECGsinus rhythm at 58 Intervals 0.16 5.08/0.50 Voltage criteria for LVH Assessment and  Plan

## 2010-10-22 NOTE — Assessment & Plan Note (Signed)
A significant issue which limits our targets of therapy as related to her aortic insufficiency

## 2010-10-22 NOTE — Patient Instructions (Signed)
Your physician recommends that you schedule a follow-up appointment as needed  

## 2010-10-22 NOTE — Assessment & Plan Note (Signed)
Well controlled 

## 2010-10-22 NOTE — Assessment & Plan Note (Signed)
The patient's heart failure is much improved compared to her hospitalization. She appears euvolemic. She was seen by Dr. Chilton Si last week. Her laboratories including her renal function were apparently within normal limits. Will not make any adjustments on her medications. She will followup with Dr. Chilton Si.

## 2010-11-16 NOTE — Discharge Summary (Signed)
[name], [name]                   ACCOUNT NO.:  [id number]   MEDICAL RECORD NO.:  [id number]          PATIENT TYPE:  INP   LOCATION:  1413                         FACILITY:  [facility]   PHYSICIAN:  Herbie Saxon, MDDATE OF BIRTH:  [birth year]   DATE OF ADMISSION:  09/20/2008  DATE OF DISCHARGE:  09/26/2008                               DISCHARGE SUMMARY   DISCHARGE DIAGNOSES:  1. Acute gastroenteritis, resolved.  2. Dehydration, improved.  3. Hypokalemia, repleted.  4. E. coli and Klebsiella urinary tract infection.  5. Bradycardia, improved.  6. Deconditioning.  7. Hypertension, stable.  8. History of transient ischemic attack.  9. History of anxiety and depression.  10.History of hiatal hernia.   RADIOLOGY:  The abdominal x-ray of September 20, 2008, shows nonobstructive  gas present.   HOSPITAL COURSE:  This is 77 year old Caucasian lady presented with  nausea and vomiting and diarrhea that had been ongoing for 2 days.  The  patient was extremely weak and lethargic at presentation.  Potassium at  presentation was 2.3.  EKG was showing sinus rhythm with positive AV  block, prolonged QT interval and new waves.  The patient was admitted  and started on IV fluid hydration with potassium supplementation  parenterally.  Stool workup was negative.  Because of chance of urinary  tract infection a urine culture was sent.  Returned positive for  Klebsiella and E. coli sensitive to Cipro.  She was noted to be very  deconditioned.  She did have an episode of fall on September 24, 2008.  PT/OT assessed and recommending 24 hour home health care and home health  PT at followup.  The patient may benefit from nursing facility placement  at a later date if 24 hour care not available.  The patient may need the  nursing home as earlier stated.  The patient is going to be discharged  to live with the daughter.   DISCHARGE CONDITION:  Stable.   FOLLOWUP:  To follow up with primary care physician,  Dr. Nila Nephew, in  the next 5-7 days.   Because of her bradycardia atenolol was held.  This has been restarted  at a lower dose but beta-blocker will be held if heart rate is less than  54 per minute or the patient becomes very weak or dizzy.  Hydralazine  was added to the BP regimen to assist with optimizing blood pressure  control.   DIET:  To be heart healthy.   ACTIVITY:  To be increased slowly as tolerated with the help of home  health.   DISCHARGE MEDICATIONS:  1. Atenolol 25 mg daily, hold if heart rate less than 54 per minute.  2. Cipro 500 mg twice daily for 3 more days.  3. Hydralazine 50 mg twice daily.  4. Tylenol 650 mg q.6 h p.r.n. for mild pain or fever.  5. Fluoxetine 40 mg daily.  6. Trazodone 50 mg q.h.s.  7. Omeprazole 20 mg daily.  8. Ultracet 50 mg b.i.d.  9. Clonazepam 0.5 mg daily.  10.Aspirin 81 mg daily.   DISCHARGE PHYSICAL EXAMINATION:  GENERAL:  On examination today she is  an elderly lady not in acute respiratory distress.  VITAL SIGNS:  Temperature 98, pulse 57, respiratory rate 18, blood  pressure 114/60.  HEENT:  Pupils equal and reactive to light and accommodation.  Extraocular muscles are intact.  Oropharynx and nasopharynx are clear.  Mucous membranes are moist.  NECK:  Supple.  HEART:  Sounds 1 and 2, regular rate and rhythm.  No murmurs, gallop or  rubs.  CHEST:  Clear.  No rales or rhonchi.  ABDOMEN:  Soft, nontender.  Bowel sounds present.  NEUROLOGICAL:  She is alert and orientated to time, place and person.  Peripheral pulses present.  No pedal edema.   LABS:  Urine culture of September 20, 2008, shows the Klebsiella pneumoniae  and E. coli, potassium level 3.5.  Chemistry - sodium is 141, chloride  107, bicarbonate 24, glucose 86, BUN 8, creatinine 0.4, C. diff  negative.  Stools for ova and parasite negative.  WBC 7, hematocrit 35,  platelet count 288.   Discharge time greater than 30 minutes.      Herbie Saxon,  MD  Electronically Signed     MIO/MEDQ  D:  09/26/2008  T:  09/26/2008  Job:  [id number]   cc:   Erskine Speed, M.D.  Fax: [phone]

## 2010-11-16 NOTE — H&P (Signed)
NAMESHACOYA, BURKHAMMER                   ACCOUNT NO.:  [id number]   MEDICAL RECORD NO.:  [id number]          PATIENT TYPE:  INP   LOCATION:  0107                         FACILITY:  [facility]   PHYSICIAN:  Pedro Earls, MD     DATE OF BIRTH:  [birth year]   DATE OF ADMISSION:  09/20/2008  DATE OF DISCHARGE:                              HISTORY & PHYSICAL   CHIEF COMPLAINT:  Nausea, vomiting and diarrhea.   HISTORY OF PRESENT ILLNESS:  This is an 77 year old white female patient  with a past medical history significant for hypertension who lives at  home, was brought in by family due to C. diff history of nausea,  vomiting and diarrhea.  According to the patient, the vomiting started  first approximately 3 days ago.  Patient was having a couple of episodes  per day, lasted for 2 days, followed by diarrhea which patient was  having 5 to 10 episodes per day which were watery.  It was not  associated with any abdominal pain or cramping.  Patient felt very weak,  lethargic, was not able to perform any activities.  Denies any fever,  chills, any shortness of breath or chest pain.   REVIEW OF SYSTEMS:  As above.  Rest of the review of systems was  negative.   PAST MEDICAL HISTORY:  1. GERD.  2. Hypertension.  3. History of TIAs.  4. Depression.  5. Hiatal hernia.  6. Anxiety.   PAST SURGICAL HISTORY:  1. Cholecystectomy.  2. Left knee replacement.   ALLERGIES:  TEGRETOL and DILANTIN.  Patient gets itching.   MEDICATIONS:  1. Fluoxetine 40 daily.  2. Trazodone 50 q.h.s.  3. Omeprazole 20 mg q.h.s.  4. Tramadol with a Tylenol combination 1 tab p.o. b.i.d. p.r.n.  5. Clonazepam 0.5 mg daily.  6. Atenolol 50 mg daily.  7. Aspirin 81 daily.   SOCIAL HISTORY:  Negative for smoking, alcohol, IV drug abuse.   FAMILY HISTORY:  Noncontributory.   PHYSICAL EXAMINATION:  VITALS:  Temperature is 97.8, pulse 70s, blood  pressure 142/82, respirations 20, pulse oximetry 92 to 97% room air.  Patient awake, alert, oriented x3, does not appear to be acute distress.  HEENT:  Pupils equal, round, reactive to light.  No icterus.  No pallor.  Extraocular movements are intact.  Oral mucosa is dry.  NECK:  Supple.  No JVD.  No lymphadenopathy.  CARDIOVASCULAR:  S1, S2.  Regular.  No murmurs, heaves or gallops.  CHEST:  Clear.  ABDOMEN:  Soft, nontender.  Bowel sounds present.  No  hepatosplenomegaly.  EXTREMITIES:  Peripheral pulses present.  No clubbing, cyanosis or  edema.  CNS:  Motor grossly intact.  Cranial nerves II through XII are intact.  SKIN:  No rashes.  MUSCULOSKELETAL:  Unremarkable.   LABS:  Potassium is 2.3.  CBC within normal range.  Abdominal x-ray with  chest x-ray did not read any acute abnormality.  Patient's EKG showed  sinus rhythm with first-degree AV block as well as prolonged QT with U  waves are also seen at the EKG.  IMPRESSION:  1. Acute gastroenteritis.  2. Dehydration.  3. Hypokalemia with EKG changes.  4. Vomiting.   PLAN:  Patient will be admitted to telemetry.  Start IV fluids with  potassium.  Check stool ova and parasite cultures and sensitivities.  Continue Zofran.  PT evaluation.  Check 12-lead EKG in the a.m.  Check  BNP in the a.m.   PRIMARY CARE PHYSICIAN:  Erskine Speed, M.D.      Pedro Earls, MD  Electronically Signed     NS/MEDQ  D:  09/20/2008  T:  09/20/2008  Job:  [id number]   cc:   Erskine Speed, M.D.  Fax: [phone]

## 2010-11-19 NOTE — Op Note (Signed)
[place]. [facility]  Patient:    [name], [name]                            MRN: [id number] Proc. Date: 12/02/99 Adm. Date:  [id number] Attending:  Cornell Barman                           Operative Report  PREOPERATIVE DIAGNOSIS:  Severe osteoarthritis, left knee.  POSTOPERATIVE DIAGNOSIS:  Severe osteoarthritis, left knee  OPERATION:  Total knee replacement on the left using a DePugh standard-sized primary cemented femoral component on the left, a 12.5 mm thick, standard- sized deep dish rotating platform insert with an LCS rotating tibial plateau, standard size, glued in, and a ______ cemented patella, standard size.  ANESTHESIA:  General.  SURGEON:  Rodney A. Chaney Malling, M.D.  ASSISTANT:  Arnoldo Morale, P.A.-C.  DESCRIPTION OF PROCEDURE:  Patient was placed on the operative table in a supine position with pneumatic tourniquet about the left upper thigh.  After satisfactory oral endotracheal anesthesia, the left lower extremity was prepped with Duraprep and draped out in the usual manner.  A Vi-Drape was placed over the operative site.  The leg was then draped out with an Esmarch and tourniquet elevated.  An incision was made starting above the patella and carried down to the tibial tubercle.  Skin edges were retracted.  Bleeders were coagulated.  The long median parapatellar incision was made and patella was reflected laterally.  The knee was then flexed.  Both medial and lateral meniscus were excised.  This gave good access to the proximal tibia.  Tibial guide #1 was placed over the anterior aspect of the tibia and centered.  This was then stabilized with fixation pins with a cutting block at appropriate depth of cutting.  The external guide was then applied and this seemed to align very nicely with the tibia.  The proximal tibial cut was then made and proximal end of the tibia was removed.  It was felt that this cut was too conservative and a  second cut about 2 mm deeper was then accomplished. Resection of the proximal tibia was then accomplished. The posterior cruciate and anterior cruciate ligament were released.  All debris was removed from the tibia side of the knee.  Throughout the procedure, great care was taken to avoid injury to the collateral ligaments and protect the collateral ligaments. Femoral guide #1 was placed over the anterior aspect of the femur and the knee flexed 90 degrees.  It was centered and drill hole was placed in the distal end of the femur.  Intermedullary rod was inserted.  With the C clamp in the distal end of the femur, the C portion was placed in the proximal tibia with the knee flexed 90 degrees.  This had a proper rotation in the femur and the femur was fixed with fixation pins.  Anterior and posterior cuts were then made on the femur.  The 4-degree valgus femoral guide was then inserted over the intermedullary rod and placed on the anterior surface of the femur.  This was locked in place with fixation pins.  With a spacer block in place, a 12.5 mm spacer block seemed to be the appropriate size for flexion and extension, and this gave good stability to the collateral ligaments in both flexion and extension.  The intermedullary rod was removed and the distal end of the femur  was then resected.  The Chamfer cutting guide was placed over the distal end of the femur, held in position with fixation pins and appropriate cuts, and drill holes were made.  Attention was then turned to the proximal end of the tibia.  The tibia was subluxed forward with ______ retractor and the tibial trial was then placed over the proximal end of the tibia.  A hole was drilled for the keel.  The tibial trial was then impacted on the proximal end of the tibia.  A 12.5 mm trial deep dish platform insert was inserted. The femoral trial was placed over the distal end of the femur.  The knee was then articulated and put through  a full range of motion.  There was excellent stability.  There was excellent flexion and full extension, no instability to varus or valgus stress.  There was slight hyperextension which was felt to be perfectly balanced.  Attention was then turned to the posterior aspect of the patella which was amputated using the patella cutting jig.  Three holes were placed in the posterior aspect of the patella following the jig and the trial patella was inserted.  The knee was then put through a full range of motion. There was slight lateral tilt of the patella and a very small lateral retinacular release was done and this stabilized the patella.  The knee was wonderfully stable and had good motion.  All the components were removed. Pulse saline lavage was used and all debris was removed.  Glue was mixed. Each of the components were then inserted starting at the tibia first, then the deep dish platform insert was put in place.  Glue was placed over the distal end of the femur and the femoral component was inserted.  Throughout procedure, excess glue was removed.  Glue was placed in the posterior aspect of the patella prosthesis and this was inserted and held in place with a clamp.  All excess glue was removed.  Throughout the procedure, the knee was then irrigated with copious amounts of antibiotic solution.  Once the glue had hardened, the knee was then put through a full range of motion.  There was excellent tracking of the patella.  The tourniquet was dropped.  All bleeders were coagulated.  A Hemovac drain was inserted in the knee.  The extensor retinaculum was then closed with heavy Ethibond sutures with the knee flexed at about 100 degrees.  The subcutaneous tissue was closed and the skin was stainless steel staples.  Sterile dressings were applied.  Patient returned to the recovery room in excellent condition.  Technically, this procedure went extremely well.  DRAINS:   Hemovac.  COMPLICATIONS:  None. DD:  12/02/99 TD:  12/06/99 Job: [id number]

## 2010-11-19 NOTE — Discharge Summary (Signed)
[place]. [facility]  Patient:    [name], [name]                            MRN: [id number] Adm. Date:  [id number] Disc. Date: [id number] Attending:  Alva Garnet.                           Discharge Summary  PROBLEM:  Acute confusion.  HISTORY OF PRESENT ILLNESS:  This 77 year old patient was presented to the emergency room and was seen by Dr. Andi Devon and admitted to the hospital with nausea, vomiting, diarrhea, hypokalemia, mild dehydration and confusion.  HOSPITAL COURSE:  The patient was admitted to the hospital and given medications for nausea and vomiting and IV fluids.  She was seen on May 27, 2000 by Dr. Renae Gloss who saw that she was improving significantly. Potassium was replaced.  By May 29, 2000, the patient was still having a little bit of diarrhea but was doing well and had no further nausea, vomiting, no abnormal pain, no signs of GI bleeding and no cramps.  Abdomen was unremarkable.  Potassium and BMET were normal and stool was sent off for evaluation.  By May 30, 2000, the patient had a few loose stools, was feeling completely well, was completely alert and oriented, her vital workup was unremarkable, and she was ready for discharge.  DISCHARGE DIAGNOSES: 1. Hypokalemia. 2. Dehydration. 3. Confusion.  DISCHARGE CONDITION:  Improved.  CONSULTATIONS:  None.  COMPLICATIONS:  None.  PROCEDURES:  None.  MRI of the brain was done on _December 24, 2001 - see separate report.  CT scan of the brain was unremarkable and that was also done on admission. Stool evaluation was negative for Clostridium difficile.  Stool culture was unremarkable.  Stool was negative for ova and parasites.  DISCHARGE CONDITION:  Improved.  DISCHARGE INSTRUCTIONS:  The sheet with the formal discharge instructions is not on the chart at this time.  The patient was sent home on full liquid diet gradually increasing as tolerated.  She was  to take all of her usual medicines.  She was to recheck with Dr. Nila Nephew in approximately one week.  ACTIVITIES:  Any.  SPECIAL PROCEDURES:  None. DD:  06/18/00 TD:  06/19/00 Job: [id number]

## 2010-11-19 NOTE — Discharge Summary (Signed)
[place]. [facility]  Patient:    [name], [name]                            MRN: [id number] Adm. Date:  [id number] Disc. Date: [id number] Attending:  Herold Harms Dictator:   Jamelle Rushing, P.A.                           Discharge Summary  ADMISSION DIAGNOSES: 1. Osteoarthritis bilateral knees, left greater than right. 2. Hypertension. 3. History of depression.  DISCHARGE DIAGNOSES: 1. Status post left total knee arthroplasty. 2. Hypertension. 3. History of depression.  HISTORY OF PRESENT ILLNESS:  The patient is a 77 year old white female with a history of greater than five years of bilateral worsening knee pain. Presently, the left knee is worse than the right for the last one year.  The pain is described as a constant ache, with stabbing pain located across the joint, without any radiation.  The pain is worsened with walking and improves with rest.  The patient does have popping, catching, and night pain.  The patient does not use any assistive device.  The patient does have minimal improvement with Celebrex.  ALLERGIES:  DILANTIN and TEGRETOL causing nausea and emesis.  CURRENT MEDICATIONS: 1. Celebrex 200 mg p.o. q.d. 2. Prozac 20 mg p.o. q.d. 3. Ambien 10 mg p.o. q.h.s. p.r.n. 4. Atenolol/chlorthalidone 50/25 1/2 tablet p.o. q.d. 5. Aspirin 81 mg p.o. q.d.  PROCEDURES:  On Dec 02, 1999, the patient was taken to the OR by Dr. Lenard Galloway. Mortenson, assisted by Arnoldo Morale, P.A.C., and had a left total knee arthroplasty performed.  The patient tolerated the procedure well.  There were no complications, and one Hemovac drain was left in place.  The patient was transferred to the recovery room in good condition.  CONSULTATIONS:  On Dec 02, 1999, the following routine consults were requested: 1. Physical therapy. 2. Occupational therapy. 3. Care management.  4. Case management.  HOSPITAL COURSE:  On Dec 02, 1999, the patient was admitted to  [facility] under the care of Dr. Thereasa Distance A. Mortenson.  The patient was taken to the OR under general anesthesia and had a left total knee arthroplasty performed.  One Hemovac drain was left in place.  The patient tolerated the procedure well and was transferred to the recovery room and then to the orthopedic floor in good condition.  The patient then proceeded to have a four-day postoperative course with the only complaint being some pruritus on the first postoperative day.  The patient was given some Benadryl, and this did seem to improve her symptoms. It was thought that maybe her pruritus was related to the PCA morphine but, since her symptoms did improve, she remained on this for her first postoperative day.  The patients wound remained benign.  Her vital signs and laboratories remained stable.  Over the next several days, the patient continued to progress very nicely with physical therapy, but it was felt that she could benefit from a couple of days on the rehabilitation unit prior to being discharged to home, being that she lived alone.  Rehabilitation was in agreement, and she was ultimately discharged to the rehabilitation services on postoperative day #4.  MEDICATIONS ON DISCHARGE TO REHABILITATION:  1. Coumadin per pharmacy dosing.  2. Dulcolax 10 mg p.o. q.d.  3. Urecholine 25 mg p.o. t.i.d.  4. K-Dur  20 mEq p.o. q.d.  5. Atenolol 25 mg p.o. q.d.  6. Chlorthalidone 12.5 mg p.o. q.d.  7. Prozac 20 mg p.o. q.d.  8. Senokot-S 1 tablet p.o. b.i.d.  9. Docusate 100 mg p.o. b.i.d. 10. Mylanta 30 ml p.o. p.r.n. 11. Percocet 1 or 2 tablets p.o. q.4-6h. p.r.n. pain.  LABORATORY DATA:  On admission, the patient had a normal sinus rhythm EKG at 67 beats per minute.  Chest x-ray showed mild cardiomegaly with hiatal hernia.  CBC on December 05, 1999, found WBC 8.8, hemoglobin 10.1, hematocrit 27.9, platelets 270.  Routine chemistries on December 05, 1999, found potassium of  3.3, CO2 of 33, glucose 111, BUN 9, creatinine 0.6, calcium of 8.2.  CONDITION ON DISCHARGE TO REHABILITATION SERVICES:  Improved and good. DD:  01/22/00 TD:  01/25/00 Job: [id number]

## 2010-11-19 NOTE — Op Note (Signed)
   NAME:  [name], [name]                               ACCOUNT NO.:  [id number]   MEDICAL RECORD NO.:  [id number]                   PATIENT TYPE:  AMB   LOCATION:  ENDO                                 FACILITY:  [facility]   PHYSICIAN:  Anselmo Rod, M.D.               DATE OF BIRTH:  January 19, 1926   DATE OF PROCEDURE:  10/18/2002  DATE OF DISCHARGE:                                 OPERATIVE REPORT   PROCEDURE:  Colonoscopy.   ENDOSCOPIST:  Anselmo Rod, M.D.   INSTRUMENT USED:  Olympus video colonoscope.   INDICATION FOR PROCEDURE:  Iron-deficiency anemia with a normal EGD (except  for a hiatal hernia) in a 77 year old white female.  Rule out colonic  polyps, masses, hemorrhoids, etc.   PRE-PROCEDURE PREPARATION:  Informed consent was procured from the patient.  The patient had fasted for eight hours prior to the procedure and prepped  with a bottle of magnesium citrate and a gallon of GoLYTELY the night prior  to the procedure.   PRE-PROCEDURE PHYSICAL:  VITAL SIGNS:  The patient had stable vital signs.  NECK:  Supple.  CHEST:  Clear to auscultation.  S1, S2 regular.  ABDOMEN:  Soft with normal bowel sounds.   DESCRIPTION OF PROCEDURE:  The patient was placed in the left lateral  decubitus position and sedated with an additional 25 mg of Demerol and 3.5  mg of Versed intravenously.  Once the patient was adequately sedate and  maintained on low-flow oxygen and continuous cardiac monitoring, the Olympus  video colonoscope was advanced from the rectum to the cecum.  There was some  stool in the cecum and the right colon.  Multiple washes were done.  The  patient's position was changed from the left lateral to the supine position  to adequately visualize the cecal base.  No masses, polyps, erosions,  ulcerations, or diverticula were seen.   IMPRESSION:  Normal colonoscopy.   RECOMMENDATIONS:  1. A small bowel follow-through will be done for the patient to complete her     GI  evaluation.  Further     recommendations made thereafter.  2. Continue serial CBCs and iron supplementation as advised by Erskine Speed, M.D.  3. Outpatient follow-up __________ related symptoms.                                               Anselmo Rod, M.D.    JNM/MEDQ  D:  10/18/2002  T:  10/18/2002  Job:  [id number]   cc:   Erskine Speed, M.D.  [address]  [place]  [place] [address]  Fax: [phone]

## 2010-11-19 NOTE — Op Note (Signed)
   NAME:  [name], [name]                               ACCOUNT NO.:  [id number]   MEDICAL RECORD NO.:  [id number]                   PATIENT TYPE:  AMB   LOCATION:  ENDO                                 FACILITY:  [facility]   PHYSICIAN:  Anselmo Rod, M.D.               DATE OF BIRTH:  [birth year]   DATE OF PROCEDURE:  10/18/2002  DATE OF DISCHARGE:                                 OPERATIVE REPORT   PROCEDURE:  Esophagogastroduodenoscopy.   ENDOSCOPIST:  Anselmo Rod, M.D.   INSTRUMENT USED:  Olympus video panendoscope.   INDICATION FOR PROCEDURE:  A 77 year old white female with a history of iron-  deficiency anemia undergoing an EGD to rule out peptic ulcer disease,  esophagitis, gastritis, etc.   PREPROCEDURE PREPARATION:  Informed consent was procured from the patient.  The patient was fasted for eight hours prior to the procedure.   PREPROCEDURE PHYSICAL:  VITAL SIGNS:  The patient had stable vital signs.  NECK:  Supple.  CHEST:  Clear to auscultation.  S1, S2 regular.  ABDOMEN:  Soft with normal bowel sounds.   DESCRIPTION OF PROCEDURE:  The patient was placed in the left lateral  decubitus position and sedated with 50 mg of Demerol and 5 mg of Versed  intravenously.  Once the patient was adequately sedate and maintained on low-  flow oxygen and continuous cardiac monitoring, the Olympus video  panendoscope was advanced through the mouthpiece, over the tongue, into the  esophagus under direct vision.  The entire esophagus appeared normal with no  evidence of ring, stricture, masses, esophagitis, or Barrett's mucosa.  The  scope was then advanced to the stomach.  There was a large hiatal hernia  seen on retroflexion in the high cardia.  The rest of the gastric mucosa and  the proximal small bowel up to 60 cm appeared normal.  There was no outlet  obstruction.  The patient tolerated the procedure well without  complications.    IMPRESSION:  Large hiatal hernia, otherwise  normal EGD.   RECOMMENDATIONS:  Proceed with colonoscopy at this time.                                               Anselmo Rod, M.D.    JNM/MEDQ  D:  10/18/2002  T:  10/18/2002  Job:  [id number]   cc:   Erskine Speed, M.D.  [address], [address]  [place]  [place] [address]  Fax: [phone]

## 2010-11-19 NOTE — Discharge Summary (Signed)
[facility]  Patient:    [name], [name]                            MRN: [id number] Adm. Date:  [id number] Disc. Date: [id number] Attending:  Herold Harms Dictator:   Bynum Bellows. Idacavage, P.A.C. CC:         Erskine Speed, M.D.             Rodney A. Chaney Malling, M.D.                           Discharge Summary  DISCHARGE DIAGNOSES: 1. Left total knee arthroplasty. 2. Hypertension. 3. Depression. 4. Degenerative joint disease. 5. Anemia. 6. Hypokalemia. 7. Coumadin anticoagulation. 8. Urinary retention. 9. Dizziness.  HISTORY OF PRESENT ILLNESS:  77 year-old white female with progressive bilateral knee pain, left greater than right, elected to undergo left total knee arthroplasty Dec 02, 1999 by Dr. Chaney Malling, placed on Coumadin for DVT prophylaxis, partial weightbearing.  At the time of discharge from acute hospital, the patient was total assist with bed mobility, moderate assist with transfers, and total assist to walk three feet with a standard walker.  PAST MEDICAL HISTORY: Significant for hypertension, depression, and facial spasms status post Botox injections and status post cholecystectomy.  ALLERGIES:  DILANTIN, DEMEROL.  SOCIAL HISTORY: The patient lives alone in a one-level house with two steps to enter.  Family in the area.  Works.  She was independent prior to admission.  HOSPITAL COURSE:  The patient was admitted to [facility] rehab on the 4th of June where she participated in greater than three hours per day of physical and occupational therapies.  Initially she had problems with pain control; however, two Percocets seemed to work quite well for her.  She was maintained on Coumadin throughout her stay with a goal INR of 2 to 3.  She did have some dizziness and light-headedness.  Orthostatic blood pressures were checked, and there was no significant variation with positional changes in her blood pressure.  She did have  some hypokalemia, and this was repleted.  The patient initially had problems with urinary retention with high postvoid residuals and was placed on Urecholine.  She responded well to this.  At the time of discharge, she was voiding spontaneously with postvoid residuals less than 200 on greater than three scants.  At the time of discharge, as far as mobility, she was noted to be modified independent with bed mobility, transfers, and ambulation.  She was able to ambulate 100 feet with the standard walker x 2.  DISCHARGE MEDICATIONS: 1. Percocet 1-2 every four hours as needed for pain or Tylenol. 2. Prozac 20 mg daily. 3. Trinsicon twice a day. 4. Urecholine 50 mg three times a day. 5. Coumadin 2 mg one daily until June 30, then stop.  ACTIVITY:  Partial weightbearing 50%, use walker.  DIET:  Balanced.  WOUND CARE:  Keep clean and dry.  [facility] for physical therapy as well as a nurse to draw protimes June 18, results to Dr. Thomasena Edis.  She is instructed to schedule and appointment with Dr. Chaney Malling in three to four weeks and Dr. Chilton Si in two weeks to rechart her blood pressure.  CONDITION ON DISCHARGE:  Stable. DD:  01/28/00 TD:  01/31/00 Job: [id number]

## 2012-12-03 ENCOUNTER — Other Ambulatory Visit: Payer: Self-pay | Admitting: Neurology

## 2012-12-17 ENCOUNTER — Other Ambulatory Visit: Payer: Self-pay | Admitting: Neurology

## 2012-12-19 ENCOUNTER — Ambulatory Visit (HOSPITAL_COMMUNITY)
Admission: RE | Admit: 2012-12-19 | Discharge: 2012-12-19 | Disposition: A | Discharge status: Home / Self Care | Payer: Medicare Other | Source: Ambulatory Visit | Attending: Internal Medicine | Admitting: Internal Medicine

## 2012-12-19 ENCOUNTER — Other Ambulatory Visit (HOSPITAL_COMMUNITY): Payer: Self-pay | Admitting: Internal Medicine

## 2012-12-19 DIAGNOSIS — R609 Edema, unspecified: Secondary | ICD-10-CM

## 2012-12-19 NOTE — Progress Notes (Signed)
VASCULAR LAB PRELIMINARY  PRELIMINARY  PRELIMINARY  PRELIMINARY  Bilateral lower extremity venous duplex completed.    Preliminary report:  Bilateral:  No evidence of DVT, superficial thrombosis, or Baker's Cyst. Right posterior tibial artery Doppler waveform is biphasic   [name], RVS 12/19/2012, 4:38 PM

## 2012-12-20 ENCOUNTER — Other Ambulatory Visit (HOSPITAL_COMMUNITY): Payer: Self-pay | Admitting: Internal Medicine

## 2012-12-20 DIAGNOSIS — R609 Edema, unspecified: Secondary | ICD-10-CM

## 2012-12-21 ENCOUNTER — Ambulatory Visit (HOSPITAL_COMMUNITY)
Admission: RE | Admit: 2012-12-21 | Discharge: 2012-12-21 | Disposition: A | Discharge status: Home / Self Care | Payer: Medicare Other | Source: Ambulatory Visit | Attending: Internal Medicine | Admitting: Internal Medicine

## 2012-12-21 DIAGNOSIS — R209 Unspecified disturbances of skin sensation: Secondary | ICD-10-CM | POA: Insufficient documentation

## 2012-12-21 DIAGNOSIS — R609 Edema, unspecified: Secondary | ICD-10-CM | POA: Insufficient documentation

## 2012-12-21 DIAGNOSIS — R0989 Other specified symptoms and signs involving the circulatory and respiratory systems: Secondary | ICD-10-CM

## 2012-12-21 NOTE — Progress Notes (Signed)
VASCULAR LAB PRELIMINARY  ARTERIAL  ABI completed: Bilateral normal ABI study. Consistent with adequate lower extremity arterial flow.    RIGHT    LEFT    PRESSURE WAVEFORM  PRESSURE WAVEFORM  BRACHIAL 151 Tri BRACHIAL 142 Tri  DP   DP    AT 121 Bi AT 124 Tri  PT 150 Tri PT 144 Bi  PER   PER    GREAT TOE  NA GREAT TOE  NA    RIGHT LEFT  ABI 0.99 0.95     Farrel Demark, RDMS, RVT 12/21/2012, 12:38 PM

## 2012-12-23 ENCOUNTER — Other Ambulatory Visit: Payer: Self-pay

## 2012-12-23 MED ORDER — DONEPEZIL HCL 10 MG PO TABS: 10.0000 mg | ORAL_TABLET | Freq: Every day | ORAL | Status: DC

## 2013-02-22 ENCOUNTER — Encounter: Payer: Self-pay | Admitting: Nurse Practitioner

## 2013-02-22 ENCOUNTER — Ambulatory Visit (INDEPENDENT_AMBULATORY_CARE_PROVIDER_SITE_OTHER): Payer: Medicare Other | Admitting: Nurse Practitioner

## 2013-02-22 VITALS — BP 146/55 | HR 55

## 2013-02-22 DIAGNOSIS — I6330 Cerebral infarction due to thrombosis of unspecified cerebral artery: Secondary | ICD-10-CM

## 2013-02-22 DIAGNOSIS — I10 Essential (primary) hypertension: Secondary | ICD-10-CM

## 2013-02-22 DIAGNOSIS — R413 Other amnesia: Secondary | ICD-10-CM

## 2013-02-22 MED ORDER — DONEPEZIL HCL 10 MG PO TABS: 10.0000 mg | ORAL_TABLET | Freq: Every day | ORAL | Status: DC

## 2013-02-22 NOTE — Patient Instructions (Addendum)
Continue Aricept at current status Increase fluid intake Try using Klonopin every other day Stop tramadol, unless  patient is in pain Followup yearly and.prn

## 2013-02-22 NOTE — Progress Notes (Signed)
Reason for visit followup for dementia HPI: [name], 79 year old  white female with a history of cerebrovascular disease, and a significant gait disorder and dementia returns for followup. She was last seen 11/21/2011 .  This patient is being managed by her daughter at home. Patient does have a long-standing tremor involving the arms and legs that is symmetric in nature, and is noted with activity as an intention tremor. This patient is able to feed herself, but has significant apraxia with use of a fork. Patient requires total assistance with bathing and dressing and ADL's.  She cannot stand or walk without assistance. She last ambulated over 2.5 yrs ago. More difficulty following instructions, sometimes has word finding difficulties. Sometimes does not recognize family members She  eats well and sleeps well. She has outside help  12 hours a day. She is incontinent of bowel and bladder. She has not had problems taking her medications.  ROS: Negative    Medications Current Outpatient Prescriptions on File Prior to Visit  Medication Sig Dispense Refill  . aspirin 81 MG tablet Take 81 mg by mouth daily.        Marland Kitchen buPROPion (WELLBUTRIN XL) 150 MG 24 hr tablet TAKE 1 TABLET BY MOUTH DAILY  30 tablet  3  . clonazePAM (KLONOPIN) 0.5 MG tablet Take 0.5 mg by mouth daily as needed (take half a tablet daily as needed).       . donepezil (ARICEPT) 10 MG tablet Take 1 tablet (10 mg total) by mouth daily.  90 tablet  0  . FLUoxetine (PROZAC) 20 MG capsule Take 40 mg by mouth daily.        . furosemide (LASIX) 40 MG tablet Take 40 mg by mouth daily.       Marland Kitchen lisinopril (PRINIVIL,ZESTRIL) 10 MG tablet Take 10 mg by mouth daily.        . metoprolol succinate (TOPROL-XL) 25 MG 24 hr tablet Take 25 mg by mouth daily. 1/2 tablet       . potassium chloride SA (K-DUR,KLOR-CON) 20 MEQ tablet Take 20 mEq by mouth daily.        . traMADol (ULTRAM) 50 MG tablet Take 50 mg by mouth every 6 (six) hours as needed.        .  traZODone (DESYREL) 50 MG tablet TAKE 1 TABLET BY MOUTH AT BEDTIME  30 tablet  2   No current facility-administered medications on file prior to visit.    Allergies  Allergies  Allergen Reactions  . Phenytoin Sodium Extended Itching  . Tegretol [Carbamazepine] Itching    itching    Physical Exam General: well developed, well nourished, seated, in no evident distress   Neurologic Exam Mental Status: Drowsy but rouses easily does not cooperate with Mini-Mental Status testing. Speech and language normal.   Cranial Nerves: Pupils equal, briskly reactive to light. Extraocular movements full with the exception with some restriction on superior gaze Visual fields full to confrontation. Hearing intact and symmetric to finger snap.  Face, tongue, palate move normally and symmetrically. .  Motor:  Normal strength in all tested extremity muscles. Mild outstretch tremor   Coordination: Unable to perform  Gait and Station: In wheelchair and not ambulatory .  Reflexes: 1+ and symmetric. Toes downgoing.     ASSESSMENT: Multi-infarct state Gait disorder, benign essential tremor, significant dementia     PLAN: Continue Aricept at current dose Increase fluid intake Try using Klonopin every other day Stop tramadol, unless  patient is  in pain Followup yearly and.prn Nilda Riggs, GNP-BC APRN

## 2013-02-22 NOTE — Progress Notes (Signed)
I have read the note, and I agree with the clinical assessment and plan.  Isebella Upshur KEITH   

## 2013-03-19 ENCOUNTER — Other Ambulatory Visit: Payer: Self-pay | Admitting: Neurology

## 2013-04-23 ENCOUNTER — Other Ambulatory Visit: Payer: Self-pay | Admitting: Neurology

## 2013-06-17 ENCOUNTER — Ambulatory Visit (INDEPENDENT_AMBULATORY_CARE_PROVIDER_SITE_OTHER): Payer: Medicare Other | Admitting: Podiatry

## 2013-06-17 ENCOUNTER — Encounter: Payer: Self-pay | Admitting: Podiatry

## 2013-06-17 VITALS — BP 147/76 | HR 60 | Resp 12

## 2013-06-17 DIAGNOSIS — M79609 Pain in unspecified limb: Secondary | ICD-10-CM

## 2013-06-17 DIAGNOSIS — B351 Tinea unguium: Secondary | ICD-10-CM

## 2013-06-17 NOTE — Progress Notes (Signed)
Subjective:     Patient ID: [name], female   DOB: 29-Jan-1926, 79 y.o.   MRN: [id number]  HPI patient's found to have thick nailbeds that are painful 1-5 both feet   Review of Systems     Objective:   Physical Exam Neurovascular status and change with thick nailbeds 1-5 both feet that are painful    Assessment:     Mycotic nail infection with pain 1-5 both feet    Plan:     Debridement of painful nailbeds 1-5 both feet with no bleeding

## 2013-06-25 ENCOUNTER — Encounter (HOSPITAL_COMMUNITY): Payer: Self-pay | Admitting: Emergency Medicine

## 2013-06-25 ENCOUNTER — Emergency Department (HOSPITAL_COMMUNITY): Payer: Medicare Other

## 2013-06-25 ENCOUNTER — Observation Stay (HOSPITAL_COMMUNITY)
Admission: EM | Admit: 2013-06-25 | Discharge: 2013-06-27 | Disposition: A | Discharge status: Home / Self Care | Payer: Medicare Other | Attending: Internal Medicine | Admitting: Internal Medicine

## 2013-06-25 ENCOUNTER — Observation Stay (HOSPITAL_COMMUNITY): Payer: Medicare Other

## 2013-06-25 DIAGNOSIS — I359 Nonrheumatic aortic valve disorder, unspecified: Secondary | ICD-10-CM | POA: Insufficient documentation

## 2013-06-25 DIAGNOSIS — F0280 Dementia in other diseases classified elsewhere without behavioral disturbance: Secondary | ICD-10-CM | POA: Diagnosis present

## 2013-06-25 DIAGNOSIS — M1990 Unspecified osteoarthritis, unspecified site: Secondary | ICD-10-CM | POA: Insufficient documentation

## 2013-06-25 DIAGNOSIS — F3289 Other specified depressive episodes: Secondary | ICD-10-CM | POA: Insufficient documentation

## 2013-06-25 DIAGNOSIS — F329 Major depressive disorder, single episode, unspecified: Secondary | ICD-10-CM | POA: Insufficient documentation

## 2013-06-25 DIAGNOSIS — R55 Syncope and collapse: Principal | ICD-10-CM | POA: Diagnosis present

## 2013-06-25 DIAGNOSIS — F0150 Vascular dementia without behavioral disturbance: Secondary | ICD-10-CM | POA: Insufficient documentation

## 2013-06-25 DIAGNOSIS — I351 Nonrheumatic aortic (valve) insufficiency: Secondary | ICD-10-CM | POA: Diagnosis present

## 2013-06-25 DIAGNOSIS — M6250 Muscle wasting and atrophy, not elsewhere classified, unspecified site: Secondary | ICD-10-CM | POA: Insufficient documentation

## 2013-06-25 DIAGNOSIS — Z7982 Long term (current) use of aspirin: Secondary | ICD-10-CM | POA: Insufficient documentation

## 2013-06-25 DIAGNOSIS — Z8673 Personal history of transient ischemic attack (TIA), and cerebral infarction without residual deficits: Secondary | ICD-10-CM

## 2013-06-25 DIAGNOSIS — F0390 Unspecified dementia without behavioral disturbance: Secondary | ICD-10-CM

## 2013-06-25 DIAGNOSIS — I5032 Chronic diastolic (congestive) heart failure: Secondary | ICD-10-CM | POA: Diagnosis present

## 2013-06-25 DIAGNOSIS — D649 Anemia, unspecified: Secondary | ICD-10-CM | POA: Diagnosis present

## 2013-06-25 DIAGNOSIS — Z79899 Other long term (current) drug therapy: Secondary | ICD-10-CM | POA: Insufficient documentation

## 2013-06-25 DIAGNOSIS — F411 Generalized anxiety disorder: Secondary | ICD-10-CM | POA: Insufficient documentation

## 2013-06-25 DIAGNOSIS — I10 Essential (primary) hypertension: Secondary | ICD-10-CM | POA: Diagnosis present

## 2013-06-25 DIAGNOSIS — I5021 Acute systolic (congestive) heart failure: Secondary | ICD-10-CM | POA: Insufficient documentation

## 2013-06-25 HISTORY — DX: Personal history of other diseases of the digestive system: Z87.19

## 2013-06-25 HISTORY — DX: Gastro-esophageal reflux disease without esophagitis: K21.9

## 2013-06-25 HISTORY — DX: Heart failure, unspecified: I50.9

## 2013-06-25 HISTORY — DX: Vascular dementia, unspecified severity, without behavioral disturbance, psychotic disturbance, mood disturbance, and anxiety: F01.50

## 2013-06-25 HISTORY — DX: Unspecified osteoarthritis, unspecified site: M19.90

## 2013-06-25 LAB — POCT I-STAT, CHEM 8
Creatinine, Ser: 0.9 mg/dL (ref 0.50–1.10)
Glucose, Bld: 101 mg/dL — ABNORMAL HIGH (ref 70–99)
Hemoglobin: 9.2 g/dL — ABNORMAL LOW (ref 12.0–15.0)
TCO2: 32 mmol/L (ref 0–100)

## 2013-06-25 LAB — URINE MICROSCOPIC-ADD ON

## 2013-06-25 LAB — CBC WITH DIFFERENTIAL/PLATELET
Basophils Relative: 1 % (ref 0–1)
Eosinophils Absolute: 0.3 10*3/uL (ref 0.0–0.7)
HCT: 28.8 % — ABNORMAL LOW (ref 36.0–46.0)
Hemoglobin: 9.1 g/dL — ABNORMAL LOW (ref 12.0–15.0)
MCH: 28 pg (ref 26.0–34.0)
MCHC: 31.6 g/dL (ref 30.0–36.0)
Monocytes Absolute: 0.5 10*3/uL (ref 0.1–1.0)
Monocytes Relative: 9 % (ref 3–12)

## 2013-06-25 LAB — URINALYSIS, ROUTINE W REFLEX MICROSCOPIC
Bilirubin Urine: NEGATIVE
Protein, ur: NEGATIVE mg/dL
Urobilinogen, UA: 2 mg/dL — ABNORMAL HIGH (ref 0.0–1.0)

## 2013-06-25 LAB — HEMOGLOBIN AND HEMATOCRIT, BLOOD: Hemoglobin: 8.4 g/dL — ABNORMAL LOW (ref 12.0–15.0)

## 2013-06-25 MED ORDER — PANTOPRAZOLE SODIUM 40 MG PO TBEC
40.0000 mg | DELAYED_RELEASE_TABLET | Freq: Every day | ORAL | Status: DC
  Administered 2013-06-26 – 2013-06-27 (×2): 40 mg via ORAL
  Filled 2013-06-25 (×2): qty 1

## 2013-06-25 MED ORDER — TRAZODONE HCL 50 MG PO TABS: 50.0000 mg | ORAL_TABLET | Freq: Every day | ORAL | Status: DC

## 2013-06-25 MED ORDER — METOPROLOL SUCCINATE 12.5 MG HALF TABLET
12.5000 mg | ORAL_TABLET | Freq: Every day | ORAL | Status: DC
  Administered 2013-06-26 – 2013-06-27 (×2): 12.5 mg via ORAL
  Filled 2013-06-25 (×2): qty 1

## 2013-06-25 MED ORDER — SODIUM CHLORIDE 0.9 % IJ SOLN
3.0000 mL | Freq: Two times a day (BID) | INTRAMUSCULAR | Status: DC
  Administered 2013-06-25 – 2013-06-26 (×2): 3 mL via INTRAVENOUS

## 2013-06-25 MED ORDER — ASPIRIN EC 81 MG PO TBEC
81.0000 mg | DELAYED_RELEASE_TABLET | Freq: Every day | ORAL | Status: DC
  Administered 2013-06-26: 81 mg via ORAL
  Filled 2013-06-25: qty 1

## 2013-06-25 MED ORDER — LISINOPRIL 10 MG PO TABS
10.0000 mg | ORAL_TABLET | Freq: Every day | ORAL | Status: DC
  Administered 2013-06-25 – 2013-06-26 (×2): 10 mg via ORAL
  Filled 2013-06-25 (×2): qty 1

## 2013-06-25 MED ORDER — BUPROPION HCL ER (XL) 150 MG PO TB24
150.0000 mg | ORAL_TABLET | Freq: Every day | ORAL | Status: DC
  Administered 2013-06-26 – 2013-06-27 (×2): 150 mg via ORAL
  Filled 2013-06-25 (×2): qty 1

## 2013-06-25 MED ORDER — STROKE: EARLY STAGES OF RECOVERY BOOK
Freq: Once | Status: AC
  Administered 2013-06-25: 23:00:00
  Filled 2013-06-25: qty 1

## 2013-06-25 MED ORDER — ACETAMINOPHEN 325 MG PO TABS: 650.0000 mg | ORAL_TABLET | Freq: Four times a day (QID) | ORAL | Status: DC | PRN

## 2013-06-25 MED ORDER — ACETAMINOPHEN 650 MG RE SUPP: 650.0000 mg | Freq: Four times a day (QID) | RECTAL | Status: DC | PRN

## 2013-06-25 MED ORDER — ONDANSETRON HCL 4 MG PO TABS: 4.0000 mg | ORAL_TABLET | Freq: Four times a day (QID) | ORAL | Status: DC | PRN

## 2013-06-25 MED ORDER — FUROSEMIDE 40 MG PO TABS
40.0000 mg | ORAL_TABLET | Freq: Every day | ORAL | Status: DC
  Administered 2013-06-26 – 2013-06-27 (×2): 40 mg via ORAL
  Filled 2013-06-25 (×2): qty 1

## 2013-06-25 MED ORDER — FLUOXETINE HCL 20 MG PO CAPS: 40.0000 mg | ORAL_CAPSULE | Freq: Every day | ORAL | Status: DC

## 2013-06-25 MED ORDER — DONEPEZIL HCL 10 MG PO TABS
10.0000 mg | ORAL_TABLET | Freq: Every day | ORAL | Status: DC
  Administered 2013-06-25 – 2013-06-27 (×3): 10 mg via ORAL
  Filled 2013-06-25 (×3): qty 1

## 2013-06-25 MED ORDER — ONDANSETRON HCL 4 MG/2ML IJ SOLN
4.0000 mg | Freq: Four times a day (QID) | INTRAMUSCULAR | Status: DC | PRN
  Filled 2013-06-25: qty 2

## 2013-06-25 MED ORDER — POTASSIUM CHLORIDE CRYS ER 20 MEQ PO TBCR
20.0000 meq | EXTENDED_RELEASE_TABLET | Freq: Every day | ORAL | Status: DC
  Administered 2013-06-26 – 2013-06-27 (×2): 20 meq via ORAL
  Filled 2013-06-25 (×3): qty 1

## 2013-06-25 NOTE — ED Notes (Signed)
Did in and out cath on patient dark urine in return 

## 2013-06-25 NOTE — ED Provider Notes (Signed)
CSN: [id number]     Arrival date & time 06/25/13  1154 History   First MD Initiated Contact with Patient 06/25/13 1204     Chief Complaint  Patient presents with  . Loss of Consciousness    syncopal episode while on commode   (Consider location/radiation/quality/duration/timing/severity/associated sxs/prior Treatment) HPI Comments: 79 year old female with an episode of syncope just prior to arrival. Per the home health nurse the patient sat down on the commode and then passed out. She's out for several minutes. No shaking or stiffening. Patient did not have any sweating. The patient seemed to be breathing shallowly during this episode. The patient has vascular dementia and is unable to provide any history. The patient seems to be slightly worse on mental status standpoint over the past couple days. She's incontinent of urine at baseline and does not seem to be getting worse. No fevers at home. Patient is DNR.   Past Medical History  Diagnosis Date  . Acute systolic heart failure   . Aortic regurgitation   . Hypertension   . Delirium, acute   . Dementia   . Anxiety   . Depression   . Muscular deconditioning   . Hypoxia     resolved  . TIA (transient ischemic attack)     history  . Anemia     history  . Degenerative joint disease     history   Past Surgical History  Procedure Laterality Date  . Total knee arthroplasty  2001    left  . Cholecystectomy     No family history on file. History  Substance Use Topics  . Smoking status: Never Smoker   . Smokeless tobacco: Never Used  . Alcohol Use: No   OB History   Grav Para Term Preterm Abortions TAB SAB Ect Mult Living                 Review of Systems  Unable to perform ROS: Dementia    Allergies  Phenytoin sodium extended and Tegretol  Home Medications   Current Outpatient Rx  Name  Route  Sig  Dispense  Refill  . aspirin 81 MG tablet   Oral   Take 81 mg by mouth daily.           Marland Kitchen buPROPion (WELLBUTRIN  XL) 150 MG 24 hr tablet   Oral   Take 150 mg by mouth daily.         Marland Kitchen dexlansoprazole (DEXILANT) 60 MG capsule   Oral   Take 60 mg by mouth daily.         Marland Kitchen donepezil (ARICEPT) 10 MG tablet   Oral   Take 1 tablet (10 mg total) by mouth daily.   30 tablet   11   . ferrous fumarate (HEMOCYTE - 106 MG FE) 325 (106 FE) MG TABS tablet   Oral   Take 1 tablet by mouth.         Marland Kitchen FLUoxetine (PROZAC) 20 MG capsule   Oral   Take 40 mg by mouth daily.           . furosemide (LASIX) 40 MG tablet   Oral   Take 40 mg by mouth daily.          Marland Kitchen lisinopril (PRINIVIL,ZESTRIL) 10 MG tablet   Oral   Take 10 mg by mouth daily.           . metoprolol succinate (TOPROL-XL) 25 MG 24 hr tablet   Oral   Take  12.5 mg by mouth daily.          . potassium chloride SA (K-DUR,KLOR-CON) 20 MEQ tablet   Oral   Take 20 mEq by mouth daily.           . traZODone (DESYREL) 50 MG tablet   Oral   Take 50 mg by mouth at bedtime.          BP 132/52  Pulse 58  Temp(Src) 98.7 F (37.1 C) (Rectal)  Resp 15  Ht 4\' 11"  (1.499 m)  Wt 100 lb (45.36 kg)  BMI 20.19 kg/m2  SpO2 100% Physical Exam  Nursing note and vitals reviewed. Constitutional: She appears well-developed and well-nourished.  HENT:  Head: Normocephalic and atraumatic.  Right Ear: External ear normal.  Left Ear: External ear normal.  Nose: Nose normal.  Eyes: Right eye exhibits no discharge. Left eye exhibits no discharge.  Cardiovascular: Normal rate, regular rhythm and normal heart sounds.   Pulmonary/Chest: Effort normal and breath sounds normal. No respiratory distress.  Abdominal: Soft. There is no tenderness.  Neurological: She is alert. She is disoriented.  Skin: Skin is warm and dry.    ED Course  Procedures (including critical care time) Labs Review Labs Reviewed  CBC WITH DIFFERENTIAL - Abnormal; Notable for the following:    RBC 3.25 (*)    Hemoglobin 9.1 (*)    HCT 28.8 (*)    RDW 16.0 (*)     Lymphocytes Relative 10 (*)    Lymphs Abs 0.5 (*)    Eosinophils Relative 6 (*)    All other components within normal limits  POCT I-STAT, CHEM 8 - Abnormal; Notable for the following:    Glucose, Bld 101 (*)    Hemoglobin 9.2 (*)    HCT 27.0 (*)    All other components within normal limits  OCCULT BLOOD, POC DEVICE - Abnormal; Notable for the following:    Fecal Occult Bld POSITIVE (*)    All other components within normal limits  URINALYSIS, ROUTINE W REFLEX MICROSCOPIC   Imaging Review Dg Chest 2 View  06/25/2013   CLINICAL DATA:  Syncope  EXAM: CHEST  2 VIEW  COMPARISON:  08/13/2010  FINDINGS: Cardiomediastinal silhouette is stable. Mild thoracic dextroscoliosis. No acute infiltrate or pleural effusion. No pulmonary edema. Osteopenia and mild degenerative changes thoracic spine.  IMPRESSION: No active disease.  Mild thoracic dextroscoliosis.   Electronically Signed   By: Natasha Mead M.D.   On: 06/25/2013 13:46   Ct Head Wo Contrast  06/25/2013   CLINICAL DATA:  Syncope with loss of consciousness  EXAM: CT HEAD WITHOUT CONTRAST  TECHNIQUE: Contiguous axial images were obtained from the base of the skull through the vertex without intravenous contrast. Study was obtained within 24 hr of patient's arrival at the emergency department.  COMPARISON:  None.  FINDINGS: Moderate diffuse atrophy is present. There is no mass, hemorrhage, extra-axial fluid collection, or midline shift. There is small vessel disease throughout the centra semiovale bilaterally elsewhere gray-white compartments are within normal limits. No acute infarct is apparent on this study.  Bony calvarium appears intact.  The mastoid air cells are clear.  IMPRESSION: Atrophy with supratentorial small vessel disease. No intracranial mass, hemorrhage, or apparent acute infarct.   Electronically Signed   By: Bretta Bang M.D.   On: 06/25/2013 13:44    EKG Interpretation    Date/Time:  Tuesday June 25 2013 12:04:36  EST Ventricular Rate:  55 PR Interval:    QRS  Duration: 92 QT Interval:  527 QTC Calculation: 504 R Axis:   19 Text Interpretation:  Atrial fibrillation Abnormal R-wave progression, early transition Prolonged QT interval No significant change since last tracing Confirmed by Delmer Kowalski  MD, Stewart Sasaki ([id number]) on 06/25/2013 12:12:11 PM            MDM   1. Syncope   2. Anemia    Patient appears at her baseline here and her vital signs are benign. She likely had a vasovagal episode due to hitting a cold toilet seat. However due to her history there is also a significant possibility of arrhythmia. She's also found to be newly anemic with positive stools for blood. Did all this recommended admission to the family, and will admit to observation for telemetry and serial hemoglobins. This point family states that patient is still DO NOT RESUSCITATE but still wants a workup for why this has happened.    Audree Camel, MD 06/25/13 1556

## 2013-06-25 NOTE — ED Notes (Signed)
NOTIFIED DR. GOLDSTON IN PERSON OF PATIENTS LAB RESULTS OF POCT OCCULT BLOOD STOOL CARD = POSITIVE ( + ) RESULTS, @14 :45 PM , 06/25/2013.

## 2013-06-25 NOTE — ED Notes (Signed)
Patient was lowered to floor, no fall

## 2013-06-25 NOTE — H&P (Addendum)
[facility] History and Physical  Kyle Stansell UJW:[id number] DOB: December 13, 1925 DOA: 06/25/2013  Referring physician: Dr Gwenlyn Fudge PCP: Enrique Sack, MD   Chief Complaint:  Syncope and collapse  History obtained from daughter Olegario Messier at bedside  HPI:  79 year old female with advanced  dementia, history of CVA, significant gait disorder who is wheelchair bound, hand tremors ( follows with Dr Anne Hahn) ,GERD, history of diastolic CHF was brought in by her daughter after having an episode of syncope this morning. Patient is widowed daughter and caregiver. She was being helped into the bathroom and then about to sit down on the commode she  syncopized with her eyes rolling and unresponsive. She responded several minutes later and EMS was called. Patient had an episode of vomiting as well. No witnessed seizure . No injury sustained . Patient is incontinent with bowel and urine at baseline. Motor denies any episode of fever or chills. Patient denied any chest discomfort, palpitation or shortness of breath. She denies any abdominal pain. No changes in her recent medications.  Patient also  has poor appetite. History limited due to patient's dementia.  Course in the ED Patient's vitals were stable.  EKG shows sinus bradycardia at 55, no ST-T changes. Chest x-ray and head CT were unremarkable for any acute findings as well. I would controlled hemoglobin of 9.1 which has decreased from 12.9 from 2012. Stool guiac was done in the ED which was positive.    Review of Systems:  As outlined in history of present illness. History limited due to patient's advanced dementia  Past Medical History  Diagnosis Date  . Acute systolic heart failure   . Aortic regurgitation   . Hypertension   . Delirium, acute   . Dementia   . Anxiety   . Depression   . Muscular deconditioning   . Hypoxia     resolved  . TIA (transient ischemic attack)     history  . Anemia     history  . Degenerative joint disease      history   Past Surgical History  Procedure Laterality Date  . Total knee arthroplasty  2001    left  . Cholecystectomy     Social History:  reports that she has never smoked. She has never used smokeless tobacco. She reports that she does not drink alcohol or use illicit drugs.  Allergies  Allergen Reactions  . Phenytoin Sodium Extended Itching  . Tegretol [Carbamazepine] Itching    itching    No family history on file.  Prior to Admission medications   Medication Sig Start Date End Date Taking? Authorizing Provider  aspirin 81 MG tablet Take 81 mg by mouth daily.     Yes Historical Provider, MD  buPROPion (WELLBUTRIN XL) 150 MG 24 hr tablet Take 150 mg by mouth daily.   Yes Historical Provider, MD  dexlansoprazole (DEXILANT) 60 MG capsule Take 60 mg by mouth daily.   Yes Historical Provider, MD  donepezil (ARICEPT) 10 MG tablet Take 1 tablet (10 mg total) by mouth daily. 02/22/13  Yes Nilda Riggs, NP  ferrous fumarate (HEMOCYTE - 106 MG FE) 325 (106 FE) MG TABS tablet Take 1 tablet by mouth.   Yes Historical Provider, MD  FLUoxetine (PROZAC) 20 MG capsule Take 40 mg by mouth daily.     Yes Historical Provider, MD  furosemide (LASIX) 40 MG tablet Take 40 mg by mouth daily.    Yes Historical Provider, MD  lisinopril (PRINIVIL,ZESTRIL) 10 MG tablet Take 10  mg by mouth daily.     Yes Historical Provider, MD  metoprolol succinate (TOPROL-XL) 25 MG 24 hr tablet Take 12.5 mg by mouth daily.    Yes Historical Provider, MD  potassium chloride SA (K-DUR,KLOR-CON) 20 MEQ tablet Take 20 mEq by mouth daily.     Yes Historical Provider, MD  traZODone (DESYREL) 50 MG tablet Take 50 mg by mouth at bedtime.   Yes Historical Provider, MD    Physical Exam:  Filed Vitals:   06/25/13 1515 06/25/13 1530 06/25/13 1541 06/25/13 1545  BP: 130/43 114/56 114/56 130/57  Pulse: 59 58 60 57  Temp:      TempSrc:      Resp: 15 13 18 17   Height:      Weight:      SpO2: 97% 96% 97% 97%     Constitutional: Vital signs reviewed. Elderly female lying in bed in no acute distress HEENT: No pallor, no icterus, moist oral mucosa Chest: Clear to auscultation bilaterally, no added sounds CVS: Normal S1-S2, normal murmurs rub or gallop Abdomen: Soft, nontender, nondistended, bowel sounds present Extremities: Warm, no edema Cardiovascular: RRR, S1 normal, S2 normal, no MRG, pulses symmetric and intact bilaterally Pulmonary/Chest: CTAB, no wheezes, rales, or rhonchi Abdominal: Soft. Non-tender, non-distended, bowel sounds are normal, no masses, organomegaly, or guarding present.   extremities: Warm, no edema CNS: AAO x1,oriented to person, responds to commands. Appears to have some residual weakness of left side but difficult to assess given her poor participation. Plantars down going b/l, normal tone and reflex b/l   Labs on Admission:  Basic Metabolic Panel:  Recent Labs Lab 06/25/13 1307  NA 142  K 4.0  CL 99  GLUCOSE 101*  BUN 19  CREATININE 0.90   Liver Function Tests: No results found for this basename: AST, ALT, ALKPHOS, BILITOT, PROT, ALBUMIN,  in the last 168 hours No results found for this basename: LIPASE, AMYLASE,  in the last 168 hours No results found for this basename: AMMONIA,  in the last 168 hours CBC:  Recent Labs Lab 06/25/13 1250 06/25/13 1307  WBC 5.2  --   NEUTROABS 3.9  --   HGB 9.1* 9.2*  HCT 28.8* 27.0*  MCV 88.6  --   PLT 274  --    Cardiac Enzymes: No results found for this basename: CKTOTAL, CKMB, CKMBINDEX, TROPONINI,  in the last 168 hours BNP: No components found with this basename: POCBNP,  CBG: No results found for this basename: GLUCAP,  in the last 168 hours  Radiological Exams on Admission: Dg Chest 2 View  06/25/2013   CLINICAL DATA:  Syncope  EXAM: CHEST  2 VIEW  COMPARISON:  08/13/2010  FINDINGS: Cardiomediastinal silhouette is stable. Mild thoracic dextroscoliosis. No acute infiltrate or pleural effusion. No  pulmonary edema. Osteopenia and mild degenerative changes thoracic spine.  IMPRESSION: No active disease.  Mild thoracic dextroscoliosis.   Electronically Signed   By: Natasha Mead M.D.   On: 06/25/2013 13:46   Ct Head Wo Contrast  06/25/2013   CLINICAL DATA:  Syncope with loss of consciousness  EXAM: CT HEAD WITHOUT CONTRAST  TECHNIQUE: Contiguous axial images were obtained from the base of the skull through the vertex without intravenous contrast. Study was obtained within 24 hr of patient's arrival at the emergency department.  COMPARISON:  None.  FINDINGS: Moderate diffuse atrophy is present. There is no mass, hemorrhage, extra-axial fluid collection, or midline shift. There is small vessel disease throughout the centra semiovale bilaterally  elsewhere gray-white compartments are within normal limits. No acute infarct is apparent on this study.  Bony calvarium appears intact.  The mastoid air cells are clear.  IMPRESSION: Atrophy with supratentorial small vessel disease. No intracranial mass, hemorrhage, or apparent acute infarct.   Electronically Signed   By: Bretta Bang M.D.   On: 06/25/2013 13:44    EKG: sinus brady @55 , prolonged qtc, no ST-T changes  Assessment/Plan  Principal Problem:   Syncope Possibly vasovagal  vs CVA vs orthostatic Admit on observation to telemetry Head CT unremarkable for acute abnormality We'll obtain MRI of the brain and 2-D echo. -Neuro checks -Resume home  dose aspirin   Active Problems:   Heart failure, diastolic, chronic  Currently euvolemic Continue Lasix and lisinopril. Check 2-D echo     Hypertension Blood pressure stable Continue home medications     Dementia Advanced. Continue Namenda. Will hold Prozac and trazodone for now given prolonged qtc continue wellbutrin. Monitor QTC    H/O: CVA (cerebrovascular accident) Continue aspirin, metoprolol    Anemia Stool for occult blood positive. appears to have dropped from baseline. We'll  monitor serial hemoglobin. Daughter does not want any invasive procedures to be done if needed.  GERD Continue PPI  Diet: cardiac DVT prophylaxis: SCDs   Code Status: DO NOT RESUSCITATE Family Communication: Daughter at bedside Disposition Plan: home once stable  Eddie North [facility] Pager [phone]  If 7PM-7AM, please contact night-coverage [email or web address] Password [facility] 06/25/2013, 4:07 PM    Total time spent: 50 minutes

## 2013-06-25 NOTE — ED Notes (Addendum)
Syncopal episode while using commode, LOC, lowered to floor by family, no actual fall.  Out for a minute or so per family. Some nausea, 4 of zofran given PTA.  EMS VS: 119/52, 58 bpm, 18 rr, 97% RA. CBG 127.

## 2013-06-26 ENCOUNTER — Other Ambulatory Visit: Payer: Self-pay

## 2013-06-26 DIAGNOSIS — I359 Nonrheumatic aortic valve disorder, unspecified: Secondary | ICD-10-CM

## 2013-06-26 MED ORDER — DEXTROSE 5 % IV SOLN
1.0000 g | INTRAVENOUS | Status: DC
  Administered 2013-06-26: 1 g via INTRAVENOUS
  Filled 2013-06-26 (×2): qty 10

## 2013-06-26 MED ORDER — SULFAMETHOXAZOLE-TRIMETHOPRIM 400-80 MG PO TABS
1.0000 | ORAL_TABLET | Freq: Two times a day (BID) | ORAL | Status: DC
  Filled 2013-06-26 (×2): qty 1

## 2013-06-26 MED ORDER — BOOST / RESOURCE BREEZE PO LIQD
1.0000 | Freq: Three times a day (TID) | ORAL | Status: DC
  Administered 2013-06-26 – 2013-06-27 (×2): 1 via ORAL

## 2013-06-26 MED ORDER — FERROUS FUMARATE 325 (106 FE) MG PO TABS
1.0000 | ORAL_TABLET | Freq: Every day | ORAL | Status: DC
  Administered 2013-06-26 – 2013-06-27 (×2): 106 mg via ORAL
  Filled 2013-06-26 (×2): qty 1

## 2013-06-26 NOTE — Progress Notes (Signed)
Patient slept quietly and without incident throughout the night.  Awakened easily to name for neurochecks as ordered.  No change in condition.  Denies pain when asked.  Will continue to monitor for changes.  Kelli Hope M

## 2013-06-26 NOTE — Progress Notes (Signed)
INITIAL NUTRITION ASSESSMENT  DOCUMENTATION CODES Per approved criteria  -Not Applicable   INTERVENTION: -Recommend that pt be seen by Speech Language Pathologist for evaluation. - Pt's diet will be changed to dysphagia III. - Resource Breeze po TID, each supplement provides 250 kcal and 9 grams of protein  NUTRITION DIAGNOSIS: Inadequate oral intake related to dementia as evidenced by reported intake less than estimated needs.   Goal: Pt to meet >/= 90% of their estimated nutrition needs   Monitor:  Wt, po intake, acceptance of supplements, SLP notes, toleration of diet  Reason for Assessment: MST  79 y.o. female  Admitting Dx: Syncope  ASSESSMENT: 79 year old female with advanced dementia, history of CVA, significant gait disorder who is wheelchair bound, hand tremors ( follows with Dr Anne Hahn) ,GERD, history of diastolic CHF was brought in by her daughter after having an episode of syncope this morning.   Per pt's daughter, pt is unable to stand on scale and usual body weight is unknown. Pt's daughter suspects some weight loss. She says that pt has been completing about 33% of her meals. When RN gave pt meds during RD visit, pt appeared to choke on water. RD and RN recommend that pt be seen by Speech Language Pathologist. Per pt daughter and caregiver, pt needs her foods chopped up and feeding assist. Pt's caregiver and daughter will provide feeding assist. Diet will be downgraded to dysphagia III. Pt has gotten sick with ensure and boost in the past. Pt will be sent [employer].   Height: Ht Readings from Last 1 Encounters:  06/25/13 4\' 11"  (1.499 m)    Weight: Wt Readings from Last 1 Encounters:  06/26/13 105 lb 8 oz (47.854 kg)    Ideal Body Weight: 43.2 kg  % Ideal Body Weight: 111%  Wt Readings from Last 10 Encounters:  06/26/13 105 lb 8 oz (47.854 kg)  10/22/10 117 lb (53.071 kg)    Usual Body Weight: unknown  % Usual Body Weight: unknown  BMI:  Body  mass index is 21.3 kg/(m^2).  Estimated Nutritional Needs: Kcal: 1200-1450 Protein: 60-95 g Fluid: >1.5 L  Skin: WNL  Diet Order: Cardiac  EDUCATION NEEDS: -Education not appropriate at this time   Intake/Output Summary (Last 24 hours) at 06/26/13 1047 Last data filed at 06/26/13 0700  Gross per 24 hour  Intake    360 ml  Output      0 ml  Net    360 ml    Last BM: prior to admission   Labs:   Recent Labs Lab 06/25/13 1307  NA 142  K 4.0  CL 99  BUN 19  CREATININE 0.90  GLUCOSE 101*    CBG (last 3)  No results found for this basename: GLUCAP,  in the last 72 hours  Scheduled Meds: . aspirin EC  81 mg Oral Daily  . buPROPion  150 mg Oral Daily  . donepezil  10 mg Oral Daily  . furosemide  40 mg Oral Daily  . lisinopril  10 mg Oral Daily  . metoprolol succinate  12.5 mg Oral Daily  . pantoprazole  40 mg Oral Daily  . potassium chloride SA  20 mEq Oral Daily  . sodium chloride  3 mL Intravenous Q12H    Continuous Infusions:   Past Medical History  Diagnosis Date  . Acute systolic heart failure   . Aortic regurgitation   . Hypertension   . Delirium, acute   . Anxiety   . Depression   .  Muscular deconditioning   . Hypoxia     resolved  . TIA (transient ischemic attack)     "multiple" (06/25/2013)  . Anemia     history  . CHF (congestive heart failure) 2012  . GERD (gastroesophageal reflux disease)   . H/O hiatal hernia   . Degenerative joint disease     history  . Arthritis     "probably all over" (06/25/2013)  . Vascular dementia     Past Surgical History  Procedure Laterality Date  . Total knee arthroplasty Left 2001  . Cholecystectomy  1990's  . Carpal tunnel release Bilateral 1980's  . Joint replacement    . Tubal ligation  1962  . Cataract extraction w/ intraocular lens  implant, bilateral Bilateral 1990's    Ebbie Latus RD, LDN

## 2013-06-26 NOTE — Progress Notes (Signed)
Echo Lab  2D Echocardiogram completed.  Thecla Forgione L Rhealyn Cullen, RDCS 06/26/2013 9:37 AM

## 2013-06-26 NOTE — Progress Notes (Signed)
UR completed 

## 2013-06-26 NOTE — Progress Notes (Signed)
[facility] PROGRESS NOTE  [name] ZOX:[id number] DOB: 25-Jul-1925 DOA: 06/25/2013 PCP: Enrique Sack, MD  Assessment/Plan: Syncope  Possibly vasovagal  vs hypotension.  Head CT unremarkable for acute abnormality. MRI negative for acute stroke.  ECHO: Ef 55 %  UTI: Start ceftriaxone, patient just vomited. Check urine culture.   Heart failure, diastolic, chronic  Currently euvolemic  Continue Lasix and lisinopril. Check 2-D echo Ef 55 %  Hypertension  Hold ACE. Continue with low dose metoprolol and lasix.   Dementia  Advanced. Continue Namenda. Will hold Prozac and trazodone for now given prolonged qtc continue wellbutrin. Monitor QTC   EKG in am.   H/O: CVA (cerebrovascular accident)  Continue  metoprolol   Anemia  Stool for occult blood positive. appears to have dropped from baseline. We'll monitor serial hemoglobin. Daughter does not want any invasive procedures to be done if needed.  Hold aspirin concern for bleeding.   GERD  Continue PPI   Code Status: DNR.  Family Communication: daughter at bedside 12-24 Disposition Plan: home 12-25 if stable.    Consultants:  none  Procedures: ECHO: Left ventricle: The cavity size was normal. Wall thickness was normal. Systolic function was normal. The estimated ejection fraction was in the range of 55% to 60%. Wall motion was normal; there were no regional wall motion abnormalities. Doppler parameters are consistent with abnormal left ventricular relaxation (grade 1 diastolic dysfunction). - Aortic valve: Moderate regurgitation. - Mitral valve: Mild regurgitation. - Left atrium: The atrium was mildly dilated.     Antibiotics: Ceftriaxone 12-24 HPI/Subjective: Patient alert. She said I am ok. Daughter at bedside.  Patient just vomit, food content. Daughter think patient was over feed today/  Daughter report blood in the stool last night. Daughter does not want aggressive procedure. She is considering  about blood transfusion in case is needed.   Objective: Filed Vitals:   06/26/13 1300  BP: 114/46  Pulse: 68  Temp: 98 F (36.7 C)  Resp:     Intake/Output Summary (Last 24 hours) at 06/26/13 1552 Last data filed at 06/26/13 1300  Gross per 24 hour  Intake    480 ml  Output      0 ml  Net    480 ml   Filed Weights   06/25/13 1207 06/26/13 0400  Weight: 45.36 kg (100 lb) 47.854 kg (105 lb 8 oz)    Exam:   General:  Alert, no distress.   Cardiovascular: S 1, S 2 RRR  Respiratory: CTA  Abdomen: BS present, soft.   Musculoskeletal: no edema.   Data Reviewed: Basic Metabolic Panel:  Recent Labs Lab 06/25/13 1307  NA 142  K 4.0  CL 99  GLUCOSE 101*  BUN 19  CREATININE 0.90   Liver Function Tests: No results found for this basename: AST, ALT, ALKPHOS, BILITOT, PROT, ALBUMIN,  in the last 168 hours No results found for this basename: LIPASE, AMYLASE,  in the last 168 hours No results found for this basename: AMMONIA,  in the last 168 hours CBC:  Recent Labs Lab 06/25/13 1250 06/25/13 1307 06/25/13 1900 06/26/13 0446  WBC 5.2  --   --   --   NEUTROABS 3.9  --   --   --   HGB 9.1* 9.2* 8.4* 8.2*  HCT 28.8* 27.0* 27.0* 25.7*  MCV 88.6  --   --   --   PLT 274  --   --   --    Cardiac Enzymes: No results  found for this basename: CKTOTAL, CKMB, CKMBINDEX, TROPONINI,  in the last 168 hours BNP (last 3 results) No results found for this basename: PROBNP,  in the last 8760 hours CBG: No results found for this basename: GLUCAP,  in the last 168 hours  No results found for this or any previous visit (from the past 240 hour(s)).   Studies: Dg Chest 2 View  06/25/2013   CLINICAL DATA:  Syncope  EXAM: CHEST  2 VIEW  COMPARISON:  08/13/2010  FINDINGS: Cardiomediastinal silhouette is stable. Mild thoracic dextroscoliosis. No acute infiltrate or pleural effusion. No pulmonary edema. Osteopenia and mild degenerative changes thoracic spine.  IMPRESSION: No active  disease.  Mild thoracic dextroscoliosis.   Electronically Signed   By: Natasha Mead M.D.   On: 06/25/2013 13:46   Ct Head Wo Contrast  06/25/2013   CLINICAL DATA:  Syncope with loss of consciousness  EXAM: CT HEAD WITHOUT CONTRAST  TECHNIQUE: Contiguous axial images were obtained from the base of the skull through the vertex without intravenous contrast. Study was obtained within 24 hr of patient's arrival at the emergency department.  COMPARISON:  None.  FINDINGS: Moderate diffuse atrophy is present. There is no mass, hemorrhage, extra-axial fluid collection, or midline shift. There is small vessel disease throughout the centra semiovale bilaterally elsewhere gray-white compartments are within normal limits. No acute infarct is apparent on this study.  Bony calvarium appears intact.  The mastoid air cells are clear.  IMPRESSION: Atrophy with supratentorial small vessel disease. No intracranial mass, hemorrhage, or apparent acute infarct.   Electronically Signed   By: Bretta Bang M.D.   On: 06/25/2013 13:44   Mr Brain Wo Contrast  06/25/2013   CLINICAL DATA:  Syncope, advanced dementia  EXAM: MRI HEAD WITHOUT CONTRAST  TECHNIQUE: Multiplanar, multiecho pulse sequences of the brain and surrounding structures were obtained without intravenous contrast.  COMPARISON:  Prior CT performed earlier on the same day  FINDINGS: Diffuse prominence of the CSF containing spaces is compatible with generalized cerebral volume loss, fairly advanced for patient age. There is compensatory enlargement of the ventricular system without evidence of hydrocephalus. Confluent and scattered hyperintense T2/FLAIR signal intensity within the periventricular and deep white matter of both cerebral hemispheres is most compatible with chronic microvascular ischemic disease. Similar changes are seen within the pons and midbrain. No mass lesion, midline shift, or extra-axial fluid collection.  No abnormal foci of restricted diffusion are  seen to suggest acute intracranial infarct. No intracranial hemorrhage seen on gradient echo sequence. Normal flow voids are seen within the intracranial vasculature. Gray-white matter differentiation is maintained.  Craniocervical junction is widely patent and normal in appearance. Pituitary gland is within normal limits. The optic nerves and globes demonstrate a normal appearance with normal signal intensity.  Calvarium demonstrates a normal appearance with normal signal intensity. Scalp soft tissues within normal limits. Visualized upper cervical spine is grossly unremarkable.  The paranasal sinuses are clear.  No mastoid effusion.  IMPRESSION: 1. No acute intracranial infarct or other process identified. 2. Advanced age-related atrophy and chronic microvascular ischemic disease.   Electronically Signed   By: Rise Mu M.D.   On: 06/25/2013 21:56    Scheduled Meds: . aspirin EC  81 mg Oral Daily  . buPROPion  150 mg Oral Daily  . cefTRIAXone (ROCEPHIN)  IV  1 g Intravenous Q24H  . donepezil  10 mg Oral Daily  . feeding supplement (RESOURCE BREEZE)  1 Container Oral TID BM  . furosemide  40 mg Oral Daily  . metoprolol succinate  12.5 mg Oral Daily  . pantoprazole  40 mg Oral Daily  . potassium chloride SA  20 mEq Oral Daily  . sodium chloride  3 mL Intravenous Q12H   Continuous Infusions:   Principal Problem:   Syncope Active Problems:   Heart failure, diastolic, chronic   Hypertension   Aortic insufficiency   Dementia   H/O: CVA (cerebrovascular accident)   Anemia   Syncope and collapse    Time spent: 30 minutes.     REGALADO,BELKYS  [facility] Pager [phone]. If 7PM-7AM, please contact night-coverage at [email or web address], password [facility] 06/26/2013, 3:52 PM  LOS: 1 day

## 2013-06-27 LAB — CBC
HCT: 27 % — ABNORMAL LOW (ref 36.0–46.0)
MCH: 27.3 pg (ref 26.0–34.0)
MCHC: 31.1 g/dL (ref 30.0–36.0)
MCV: 87.7 fL (ref 78.0–100.0)
Platelets: 287 10*3/uL (ref 150–400)
RBC: 3.08 MIL/uL — ABNORMAL LOW (ref 3.87–5.11)
RDW: 15.7 % — ABNORMAL HIGH (ref 11.5–15.5)

## 2013-06-27 LAB — BASIC METABOLIC PANEL
CO2: 31 mEq/L (ref 19–32)
Calcium: 8.2 mg/dL — ABNORMAL LOW (ref 8.4–10.5)
Chloride: 103 mEq/L (ref 96–112)
Creatinine, Ser: 0.77 mg/dL (ref 0.50–1.10)
GFR calc Af Amer: 86 mL/min — ABNORMAL LOW (ref >90)
GFR calc non Af Amer: 74 mL/min — ABNORMAL LOW (ref >90)
Glucose, Bld: 94 mg/dL (ref 70–99)

## 2013-06-27 MED ORDER — SULFAMETHOXAZOLE-TMP DS 800-160 MG PO TABS: 1.0000 | ORAL_TABLET | Freq: Two times a day (BID) | ORAL | Status: DC

## 2013-06-27 MED ORDER — BOOST / RESOURCE BREEZE PO LIQD: 1.0000 | Freq: Three times a day (TID) | ORAL | Status: DC

## 2013-06-27 NOTE — Progress Notes (Signed)
Discharge instructions and prescriptions given.  Left via wheelchair with family members.    Rubye Oaks

## 2013-06-27 NOTE — Progress Notes (Signed)
   CARE MANAGEMENT NOTE 06/27/2013  Patient:  ADELEI, SCOBEY   Account Number:  [id number]  Date Initiated:  06/27/2013  Documentation initiated by:  [facility]  Subjective/Objective Assessment:     Action/Plan:   Anticipated DC Date:  06/27/2013   Anticipated DC Plan:  HOME W HOME HEALTH SERVICES      DC Planning Services  CM consult      [facility] Choice  HOME HEALTH   Choice offered to / List presented to:  C-4 Adult Children   DME arranged  HOSPITAL BED      DME agency  [facility]     HH arranged  HH-2 PT  HH-3 OT  HH-4 NURSE'S AIDE      HH agency  [facility]   Status of service:  Completed, signed off Medicare Important Message given?   (If response is "NO", the following Medicare IM given date fields will be blank) Date Medicare IM given:   Date Additional Medicare IM given:    Discharge Disposition:  HOME W HOME HEALTH SERVICES  Per UR Regulation:    If discussed at Long Length of Stay Meetings, dates discussed:    Comments:  06/27/2013 1500 NCM spoke to dtr, [name]. Offered choice for [facility]. Dtr states she is familiar with [facility] from her father's care in the past. Faxed orders for hospital bed and [facility] PT/OT, aide to [facility]. Explained they may not be able to deliver bed today. Dtr states she understood. Isidoro Donning RN CCM Case Mgmt phone [phone]

## 2013-06-27 NOTE — Discharge Summary (Signed)
Physician Discharge Summary  Cordia Miklos ZOX:[id number] DOB: [birth year] DOA: 06/25/2013  PCP: Enrique Sack, MD  Admit date: 06/25/2013 Discharge date: 06/27/2013  Time spent: 35 minutes  Recommendations for Outpatient Follow-up:  1. Need EKG to follow QT interval.  2. Discussed with daughter palliative care options.  3. Follow up urine culture results.  4. reassess need to resume aspirin.  5. Need cbc to follow hb level.   Discharge Diagnoses:    Syncope   Prolong QT   Heart failure, diastolic, chronic   Hypertension   Aortic insufficiency   Dementia   H/O: CVA (cerebrovascular accident)   Anemia   Syncope and collapse   Discharge Condition: stable.   Diet recommendation: Regular diet.   Filed Weights   06/25/13 1207 06/26/13 0400 06/27/13 0450  Weight: 45.36 kg (100 lb) 47.854 kg (105 lb 8 oz) 46.675 kg (102 lb 14.4 oz)    History of present illness:  79 year old female with advanced dementia, history of CVA, significant gait disorder who is wheelchair bound, hand tremors ( follows with Dr Anne Hahn) ,GERD, history of diastolic CHF was brought in by her daughter after having an episode of syncope this morning. Patient is widowed daughter and caregiver. She was being helped into the bathroom and then about to sit down on the commode she syncopized with her eyes rolling and unresponsive. She responded several minutes later and EMS was called. Patient had an episode of vomiting as well. No witnessed seizure . No injury sustained . Patient is incontinent with bowel and urine at baseline. Motor denies any episode of fever or chills. Patient denied any chest discomfort, palpitation or shortness of breath. She denies any abdominal pain. No changes in her recent medications. Patient also has poor appetite. History limited due to patient's dementia   Hospital Course:  Syncope  Possibly vasovagal vs hypotension.  Head CT unremarkable for acute abnormality. MRI negative for acute  stroke.  ECHO: Ef 55 %  BP medications adjusted.  Medications that could affect QT stopped (trazadone, Prozac).  UTI: received one dose of ceftriaxone. Will discharge patient on bactrim. Will call daughter and call prescription to pharmacy.  Heart failure, diastolic, chronic  Currently euvolemic  Continue Lasix and lisinopril. Check 2-D echo Ef 55 %  Hypertension  Hold ACE. Continue with low dose metoprolol and lasix.  Dementia  Advanced. Continue Namenda. Will hold Prozac and trazodone for now given prolonged qtc continue wellbutrin. Monitor QTC  H/O: CVA (cerebrovascular accident)  Continue metoprolol  Anemia  Stool for occult blood positive. appears to have dropped from baseline. We'll monitor serial hemoglobin. Daughter does not want any invasive procedures to be done if needed.  Hold aspirin concern for bleeding.  GERD  Continue PPI   Procedures: ECHO: Left ventricle: The cavity size was normal. Wall thickness was normal. Systolic function was normal. The estimated ejection fraction was in the range of 55% to 60%. Wall motion was normal; there were no regional wall motion abnormalities. Doppler parameters are consistent with abnormal left ventricular relaxation (grade 1 diastolic dysfunction). - Aortic valve: Moderate regurgitation. - Mitral valve: Mild regurgitation. - Left atrium: The atrium was mildly dilated.    Consultations:  none  Discharge Exam: Filed Vitals:   06/27/13 0450  BP: 119/43  Pulse: 56  Temp: 98.4 F (36.9 C)  Resp: 18    General: no distress.  Cardiovascular: S 1, S 2 RRR Respiratory: CTA  Discharge Instructions  Discharge Orders   Future Appointments Provider Department  Dept Phone   09/09/2013 2:00 PM Lenn Sink, DPM [facility] [phone]   02/21/2014 11:00 AM Nilda Riggs, NP [facility] [phone]   Future Orders Complete By Expires   Diet - low sodium heart healthy  As  directed    Increase activity slowly  As directed        Medication List    STOP taking these medications       aspirin 81 MG tablet     FLUoxetine 20 MG capsule  Commonly known as:  PROZAC     lisinopril 10 MG tablet  Commonly known as:  PRINIVIL,ZESTRIL     traZODone 50 MG tablet  Commonly known as:  DESYREL      TAKE these medications       buPROPion 150 MG 24 hr tablet  Commonly known as:  WELLBUTRIN XL  Take 150 mg by mouth daily.     DEXILANT 60 MG capsule  Generic drug:  dexlansoprazole  Take 60 mg by mouth daily.     donepezil 10 MG tablet  Commonly known as:  ARICEPT  Take 1 tablet (10 mg total) by mouth daily.     feeding supplement (RESOURCE BREEZE) Liqd  Take 1 Container by mouth 3 (three) times daily between meals.     ferrous fumarate 325 (106 FE) MG Tabs tablet  Commonly known as:  HEMOCYTE - 106 mg FE  Take 1 tablet by mouth.     furosemide 40 MG tablet  Commonly known as:  LASIX  Take 40 mg by mouth daily.     metoprolol succinate 25 MG 24 hr tablet  Commonly known as:  TOPROL-XL  Take 12.5 mg by mouth daily.     potassium chloride SA 20 MEQ tablet  Commonly known as:  K-DUR,KLOR-CON  Take 20 mEq by mouth daily.       Allergies  Allergen Reactions  . Phenytoin Sodium Extended Itching  . Tegretol [Carbamazepine] Itching    itching       Follow-up Information   Follow up with GREEN, Lorenda Ishihara, MD In 3 days.   Specialty:  Internal Medicine   Contact information:   [address] [place] [address] [phone]        The results of significant diagnostics from this hospitalization (including imaging, microbiology, ancillary and laboratory) are listed below for reference.    Significant Diagnostic Studies: Dg Chest 2 View  06/25/2013   CLINICAL DATA:  Syncope  EXAM: CHEST  2 VIEW  COMPARISON:  08/13/2010  FINDINGS: Cardiomediastinal silhouette is stable. Mild thoracic dextroscoliosis. No acute infiltrate or  pleural effusion. No pulmonary edema. Osteopenia and mild degenerative changes thoracic spine.  IMPRESSION: No active disease.  Mild thoracic dextroscoliosis.   Electronically Signed   By: Natasha Mead M.D.   On: 06/25/2013 13:46   Ct Head Wo Contrast  06/25/2013   CLINICAL DATA:  Syncope with loss of consciousness  EXAM: CT HEAD WITHOUT CONTRAST  TECHNIQUE: Contiguous axial images were obtained from the base of the skull through the vertex without intravenous contrast. Study was obtained within 24 hr of patient's arrival at the emergency department.  COMPARISON:  None.  FINDINGS: Moderate diffuse atrophy is present. There is no mass, hemorrhage, extra-axial fluid collection, or midline shift. There is small vessel disease throughout the centra semiovale bilaterally elsewhere gray-white compartments are within normal limits. No acute infarct is apparent on this study.  Bony calvarium appears  intact.  The mastoid air cells are clear.  IMPRESSION: Atrophy with supratentorial small vessel disease. No intracranial mass, hemorrhage, or apparent acute infarct.   Electronically Signed   By: Bretta Bang M.D.   On: 06/25/2013 13:44   Mr Brain Wo Contrast  06/25/2013   CLINICAL DATA:  Syncope, advanced dementia  EXAM: MRI HEAD WITHOUT CONTRAST  TECHNIQUE: Multiplanar, multiecho pulse sequences of the brain and surrounding structures were obtained without intravenous contrast.  COMPARISON:  Prior CT performed earlier on the same day  FINDINGS: Diffuse prominence of the CSF containing spaces is compatible with generalized cerebral volume loss, fairly advanced for patient age. There is compensatory enlargement of the ventricular system without evidence of hydrocephalus. Confluent and scattered hyperintense T2/FLAIR signal intensity within the periventricular and deep white matter of both cerebral hemispheres is most compatible with chronic microvascular ischemic disease. Similar changes are seen within the pons and  midbrain. No mass lesion, midline shift, or extra-axial fluid collection.  No abnormal foci of restricted diffusion are seen to suggest acute intracranial infarct. No intracranial hemorrhage seen on gradient echo sequence. Normal flow voids are seen within the intracranial vasculature. Gray-white matter differentiation is maintained.  Craniocervical junction is widely patent and normal in appearance. Pituitary gland is within normal limits. The optic nerves and globes demonstrate a normal appearance with normal signal intensity.  Calvarium demonstrates a normal appearance with normal signal intensity. Scalp soft tissues within normal limits. Visualized upper cervical spine is grossly unremarkable.  The paranasal sinuses are clear.  No mastoid effusion.  IMPRESSION: 1. No acute intracranial infarct or other process identified. 2. Advanced age-related atrophy and chronic microvascular ischemic disease.   Electronically Signed   By: Rise Mu M.D.   On: 06/25/2013 21:56    Microbiology: No results found for this or any previous visit (from the past 240 hour(s)).   Labs: Basic Metabolic Panel:  Recent Labs Lab 06/25/13 1307 06/27/13 0557  NA 142 141  K 4.0 3.9  CL 99 103  CO2  --  31  GLUCOSE 101* 94  BUN 19 19  CREATININE 0.90 0.77  CALCIUM  --  8.2*   Liver Function Tests: No results found for this basename: AST, ALT, ALKPHOS, BILITOT, PROT, ALBUMIN,  in the last 168 hours No results found for this basename: LIPASE, AMYLASE,  in the last 168 hours No results found for this basename: AMMONIA,  in the last 168 hours CBC:  Recent Labs Lab 06/25/13 1250 06/25/13 1307 06/25/13 1900 06/26/13 0446 06/27/13 0557  WBC 5.2  --   --   --  4.7  NEUTROABS 3.9  --   --   --   --   HGB 9.1* 9.2* 8.4* 8.2* 8.4*  HCT 28.8* 27.0* 27.0* 25.7* 27.0*  MCV 88.6  --   --   --  87.7  PLT 274  --   --   --  287   Cardiac Enzymes: No results found for this basename: CKTOTAL, CKMB,  CKMBINDEX, TROPONINI,  in the last 168 hours BNP: BNP (last 3 results) No results found for this basename: PROBNP,  in the last 8760 hours CBG: No results found for this basename: GLUCAP,  in the last 168 hours     Signed:  REGALADO,BELKYS  [facility] 06/27/2013, 12:07 PM

## 2013-06-29 LAB — URINE CULTURE

## 2013-09-09 ENCOUNTER — Ambulatory Visit: Payer: Medicare Other | Admitting: Podiatry

## 2013-09-23 ENCOUNTER — Encounter: Payer: Self-pay | Admitting: Podiatry

## 2013-09-23 ENCOUNTER — Ambulatory Visit (INDEPENDENT_AMBULATORY_CARE_PROVIDER_SITE_OTHER): Payer: Medicare Other | Admitting: Podiatry

## 2013-09-23 VITALS — BP 143/66 | HR 53 | Resp 16

## 2013-09-23 DIAGNOSIS — M79609 Pain in unspecified limb: Secondary | ICD-10-CM

## 2013-09-23 DIAGNOSIS — B351 Tinea unguium: Secondary | ICD-10-CM

## 2013-09-24 NOTE — Progress Notes (Signed)
Subjective:     Patient ID: [name], female   DOB: 1926-01-21, 80 y.o.   MRN: [id number]  HPI patient presents with thick nail bed 1-5 both feet that she cannot cut   Review of Systems     Objective:   Physical Exam Neurovascular status unchanged with thick painful nailbeds 1-5 both feet better and possible cut    Assessment:     Mycotic nail infection with pain 1-5 both feet    Plan:     Debridement painful nailbeds 1-5 both feet with no bleeding noted

## 2013-11-12 ENCOUNTER — Ambulatory Visit: Payer: Medicare Other | Admitting: Gastroenterology

## 2013-12-02 ENCOUNTER — Encounter: Payer: Self-pay | Admitting: Nurse Practitioner

## 2013-12-30 ENCOUNTER — Ambulatory Visit (INDEPENDENT_AMBULATORY_CARE_PROVIDER_SITE_OTHER): Payer: Medicare Other | Admitting: Podiatry

## 2013-12-30 ENCOUNTER — Encounter: Payer: Self-pay | Admitting: Podiatry

## 2013-12-30 DIAGNOSIS — M79673 Pain in unspecified foot: Secondary | ICD-10-CM

## 2013-12-30 DIAGNOSIS — B351 Tinea unguium: Secondary | ICD-10-CM

## 2013-12-30 DIAGNOSIS — M79609 Pain in unspecified limb: Secondary | ICD-10-CM

## 2013-12-31 NOTE — Progress Notes (Signed)
Subjective:     Patient ID: Jenny PeakEdna Haman, female   DOB: 24-Jan-1926, 80 y.o.   MRN: [id number]  HPI patient has thick painful nailbeds 1-5 both feet that she cannot cut   Review of Systems     Objective:   Physical Exam Neurovascular status intact with thick yellow brittle nailbeds 1-5 both feet    Assessment:     Mycotic nail infection is with pain 1-5 both feet    Plan:      debride painful nailbeds 1-5 both feet with no iatrogenic bleeding noted

## 2014-01-14 ENCOUNTER — Ambulatory Visit (INDEPENDENT_AMBULATORY_CARE_PROVIDER_SITE_OTHER): Payer: Medicare Other | Admitting: Internal Medicine

## 2014-01-14 ENCOUNTER — Encounter: Payer: Self-pay | Admitting: Internal Medicine

## 2014-01-14 VITALS — BP 130/60 | HR 60 | Ht 59.0 in | Wt 116.1 lb

## 2014-01-14 DIAGNOSIS — R1110 Vomiting, unspecified: Secondary | ICD-10-CM

## 2014-01-14 DIAGNOSIS — K219 Gastro-esophageal reflux disease without esophagitis: Secondary | ICD-10-CM

## 2014-01-14 DIAGNOSIS — R1319 Other dysphagia: Secondary | ICD-10-CM

## 2014-01-14 DIAGNOSIS — IMO0001 Reserved for inherently not codable concepts without codable children: Secondary | ICD-10-CM

## 2014-01-14 NOTE — Patient Instructions (Signed)
You have been scheduled for a Barium Esophogram and an Upper Gi Series at [facility] (1st floor of the hospital) on 01/15/2014 at 12:30pm. Please arrive 15 minutes prior to your appointment for registration. Make certain not to have anything to eat or drink 6 hours prior to your test. If you need to reschedule for any reason, please contact radiology at [phone] to do so. __________________________________________________________________ A barium swallow is an examination that concentrates on views of the esophagus. This tends to be a double contrast exam (barium and two liquids which, when combined, create a gas to distend the wall of the oesophagus) or single contrast (non-ionic iodine based). The study is usually tailored to your symptoms so a good history is essential. Attention is paid during the study to the form, structure and configuration of the esophagus, looking for functional disorders (such as aspiration, dysphagia, achalasia, motility and reflux) EXAMINATION You may be asked to change into a gown, depending on the type of swallow being performed. A radiologist and radiographer will perform the procedure. The radiologist will advise you of the type of contrast selected for your procedure and direct you during the exam. You will be asked to stand, sit or lie in several different positions and to hold a small amount of fluid in your mouth before being asked to swallow while the imaging is performed .In some instances you may be asked to swallow barium coated marshmallows to assess the motility of a solid food bolus. The exam can be recorded as a digital or video fluoroscopy procedure. POST PROCEDURE It will take 1-2 days for the barium to pass through your system. To facilitate this, it is important, unless otherwise directed, to increase your fluids for the next 24-48hrs and to resume your normal diet.  This test typically takes about 30 minutes to  perform. __________________________________________________________________________________

## 2014-01-14 NOTE — Progress Notes (Signed)
HISTORY OF PRESENT ILLNESS:  [name] is a 80 y.o. female with multiple medical problems as listed below, including significant dementia with memory deficit. She sent today by Dr. Chilton Si regarding chronic problems with regurgitation, vomiting, and belching. The patient is accompanied by her daughter, [name], who provides the history. She states that these problems have been intermittent for several years. Worse over the past year. She has had significant dementia for which she moved in her daughter's home approximately 6 years ago. At times, the patient will eat and complain of epigastric discomfort followed by vomiting. Some trouble initiating swallowing with liquids, but no coughing or nasal regurgitation. Regurgitation episodes can occur late at night. At one point she had nonspecific diminished appetite, but this has improved. She previously had her GI care provided by Dr. Loreta Ave. Outside records reviewed including normal colonoscopy in 2004 an upper endoscopy with large hiatal hernia 2004. Flexible sigmoidoscopy with removal of diminutive rectal adenoma in 2010. Patient has been on several PPIs, currently Dexilant without resolution of the problem. GI review of systems is otherwise remarkable for fecal incontinence  REVIEW OF SYSTEMS:  All non-GI ROS negative except for anxiety, arthritis, confusion, urinary leakage, memory problems  Past Medical History  Diagnosis Date  . Acute systolic heart failure   . Aortic regurgitation   . Hypertension   . Delirium, acute   . Anxiety   . Depression   . Muscular deconditioning   . Hypoxia     resolved  . TIA (transient ischemic attack)     "multiple" (06/25/2013)  . Anemia     history  . CHF (congestive heart failure) 2012  . GERD (gastroesophageal reflux disease)   . H/O hiatal hernia   . Degenerative joint disease     history  . Arthritis     "probably all over" (06/25/2013)  . Vascular dementia     Past Surgical History  Procedure  Laterality Date  . Total knee arthroplasty Left 2001  . Cholecystectomy  1990's  . Carpal tunnel release Bilateral 1980's  . Tubal ligation  1962  . Cataract extraction w/ intraocular lens  implant, bilateral Bilateral 1990's    Social History Dellene Mcgroarty  reports that she has never smoked. She has never used smokeless tobacco. She reports that she does not drink alcohol or use illicit drugs.  family history includes Breast cancer in her sister; Heart attack in her mother; Ovarian cancer in her daughter; Prostate cancer in her brother.  Allergies  Allergen Reactions  . Dilantin [Phenytoin Sodium Extended]   . Phenytoin Sodium Extended Itching  . Tegretol [Carbamazepine] Itching    itching       PHYSICAL EXAMINATION: Vital signs: BP 130/60  Pulse 60  Ht 4\' 11"  (1.499 m)  Wt 116 lb 2 oz (52.674 kg)  BMI 23.44 kg/m2 General: Pleasant, frail elderly female, well-nourished, no acute distress HEENT: Sclerae are anicteric, conjunctiva pink. Oral mucosa intact. No thrush Lungs: Clear Heart: Regular with systolic murmur Abdomen: soft, nontender, nondistended, no obvious ascites, no peritoneal signs, normal bowel sounds. No organomegaly. Extremities: No edema Psychiatric: alert and oriented to person. Cooperative   ASSESSMENT:  #1. Regurgitation and possible dysphagia. Difficult to know for sure based on history, given the patient's dementia. #2. Large hiatal hernia described on EGD in 2004. #3. Normal colonoscopy 2004. Diminutive adenoma on flexible sigmoidoscopy 2010. #4. Multiple significant medical problems and advanced age   PLAN:  #1. Barium swallow with tablet and upper GI series. This  to rule out significant obstruction (stricture) and evaluate hiatal hernia for complicated features such as paraesophageal component. We will contact the patient's daughter after these results are available. If significant stricture amenable to esophageal dilation, then EGD with such to be  considered. We discussed this as well. #2. Reflux precautions #3. Continue PPI

## 2014-01-15 ENCOUNTER — Ambulatory Visit (HOSPITAL_COMMUNITY)
Admission: RE | Admit: 2014-01-15 | Discharge: 2014-01-15 | Disposition: A | Discharge status: Home / Self Care | Payer: Medicare Other | Source: Ambulatory Visit | Attending: Internal Medicine | Admitting: Internal Medicine

## 2014-01-15 DIAGNOSIS — R109 Unspecified abdominal pain: Secondary | ICD-10-CM | POA: Insufficient documentation

## 2014-01-15 DIAGNOSIS — K228 Other specified diseases of esophagus: Secondary | ICD-10-CM | POA: Insufficient documentation

## 2014-01-15 DIAGNOSIS — R1319 Other dysphagia: Secondary | ICD-10-CM

## 2014-01-15 DIAGNOSIS — R1310 Dysphagia, unspecified: Secondary | ICD-10-CM | POA: Insufficient documentation

## 2014-01-15 DIAGNOSIS — K219 Gastro-esophageal reflux disease without esophagitis: Secondary | ICD-10-CM | POA: Insufficient documentation

## 2014-01-15 DIAGNOSIS — K2289 Other specified disease of esophagus: Secondary | ICD-10-CM | POA: Insufficient documentation

## 2014-01-15 DIAGNOSIS — IMO0001 Reserved for inherently not codable concepts without codable children: Secondary | ICD-10-CM

## 2014-01-15 DIAGNOSIS — R1110 Vomiting, unspecified: Secondary | ICD-10-CM

## 2014-01-16 ENCOUNTER — Other Ambulatory Visit: Payer: Self-pay

## 2014-01-16 DIAGNOSIS — K449 Diaphragmatic hernia without obstruction or gangrene: Secondary | ICD-10-CM

## 2014-02-21 ENCOUNTER — Telehealth: Payer: Self-pay | Admitting: *Deleted

## 2014-02-21 ENCOUNTER — Ambulatory Visit: Payer: Medicare Other | Admitting: Nurse Practitioner

## 2014-02-21 ENCOUNTER — Ambulatory Visit (INDEPENDENT_AMBULATORY_CARE_PROVIDER_SITE_OTHER): Payer: Medicare Other | Admitting: Surgery

## 2014-02-21 ENCOUNTER — Encounter (INDEPENDENT_AMBULATORY_CARE_PROVIDER_SITE_OTHER): Payer: Self-pay | Admitting: Surgery

## 2014-02-21 VITALS — BP 122/50 | HR 58 | Temp 97.6°F | Ht 62.0 in | Wt 115.0 lb

## 2014-02-21 DIAGNOSIS — K449 Diaphragmatic hernia without obstruction or gangrene: Secondary | ICD-10-CM

## 2014-02-21 NOTE — Progress Notes (Signed)
Chief Complaint:  1. Large, predominantly paraesophageal hiatal hernia with some  degree of organoaxial rotation. No evidence esophageal or gastric  obstruction.  2. Small amount gastroesophageal reflux.   History of Present Illness:  [name] is an 80 y.o. female who is brought in by her caregiver and her daughter with whom she lives for dysphagia and a large hiatal hernia.  Many years ago she had significant heartburn and has a sizer hiatal hernia has grown this has become less of a problem and she now has more problems with obstruction. She does spit up a lot of foamy saliva and when she she will have pain and then vomit food. The upper GI shows a large hiatal hernia.  I discussed these findings with her daughter and caregiver and with the patient. While I could operate on her and perform a laparoscopic reduction of her hiatal hernia possibly with a gastropexy to bring her stomach out of her chest and straighten it out to hopefully allow for her to eat and I might possibly  place a G-tube I think that this should be thought about in the context of her dementia and age. I suggested that the daughter speak with the other 2 siblings and discuss with Dr. Chilton Si.     Past Medical History  Diagnosis Date  . Acute systolic heart failure   . Aortic regurgitation   . Hypertension   . Delirium, acute   . Anxiety   . Depression   . Muscular deconditioning   . Hypoxia     resolved  . TIA (transient ischemic attack)     "multiple" (06/25/2013)  . Anemia     history  . CHF (congestive heart failure) 2012  . GERD (gastroesophageal reflux disease)   . H/O hiatal hernia   . Degenerative joint disease     history  . Arthritis     "probably all over" (06/25/2013)  . Vascular dementia     Past Surgical History  Procedure Laterality Date  . Total knee arthroplasty Left 2001  . Cholecystectomy  1990's  . Carpal tunnel release Bilateral 1980's  . Tubal ligation  1962  . Cataract extraction w/  intraocular lens  implant, bilateral Bilateral 1990's    Current Outpatient Prescriptions  Medication Sig Dispense Refill  . buPROPion (WELLBUTRIN XL) 150 MG 24 hr tablet Take 150 mg by mouth daily.      Marland Kitchen donepezil (ARICEPT) 10 MG tablet Take 1 tablet (10 mg total) by mouth daily.  30 tablet  11  . ferrous sulfate 325 (65 FE) MG tablet Take 325 mg by mouth daily with breakfast.      . furosemide (LASIX) 40 MG tablet Take 40 mg by mouth daily.       . metoprolol succinate (TOPROL-XL) 25 MG 24 hr tablet Take 12.5 mg by mouth daily.       Marland Kitchen omeprazole (PRILOSEC) 10 MG capsule Take 10 mg by mouth daily.      . potassium chloride SA (K-DUR,KLOR-CON) 20 MEQ tablet Take 20 mEq by mouth daily.         No current facility-administered medications for this visit.   Dilantin; Phenytoin sodium extended; and Tegretol Family History  Problem Relation Age of Onset  . Ovarian cancer Daughter   . Prostate cancer Brother     mets to bones  . Breast cancer Sister   . Heart attack Mother    Social History:   reports that she has never smoked. She has  never used smokeless tobacco. She reports that she does not drink alcohol or use illicit drugs.   REVIEW OF SYSTEMS : Negative except for positive age and dementia related concerns  Physical Exam:   Blood pressure 122/50, pulse 58, temperature 97.6 F (36.4 C), temperature source Oral, height 5\' 2"  (1.575 m), weight 115 lb (52.164 kg). Body mass index is 21.03 kg/(m^2).  Gen:  WDWN WF NAD  Neurological: Alert and oriented to person, place, and time. Motor and sensory function is grossly intact  Head: Normocephalic and atraumatic.  Eyes: Conjunctivae are normal. Pupils are equal, round, and reactive to light. No scleral icterus.  Neck: Normal range of motion. Neck supple. No tracheal deviation or thyromegaly present.  Cardiovascular:  SR without murmurs or gallops.  No carotid bruits Breast:  Not examined  Respiratory: Effort normal.  No  respiratory distress. No chest wall tenderness. Breath sounds normal.  No wheezes, rales or rhonchi.  Abdomen:  nontender GU:  Not examined Musculoskeletal: Normal range of motion. Extremities are nontender. No cyanosis, edema or clubbing noted Lymphadenopathy: No cervical, preauricular, postauricular or axillary adenopathy is present Skin: Skin is warm and dry. No rash noted. No diaphoresis. No erythema. No pallor. Pscyh: Normal mood and affect. Behavior is normal. Judgment and thought content normal.   LABORATORY RESULTS: No results found for this or any previous visit (from the past 48 hour(s)).   RADIOLOGY RESULTS: No results found.  Problem List: Patient Active Problem List   Diagnosis Date Noted  . Syncope 06/25/2013  . H/O: CVA (cerebrovascular accident) 06/25/2013  . Anemia 06/25/2013  . Syncope and collapse 06/25/2013  . Cerebral thrombosis with cerebral infarction 02/22/2013  . Memory loss 02/22/2013  . Heart failure, diastolic, chronic 10/22/2010  . Hypertension 10/22/2010  . Aortic insufficiency 10/22/2010  . Dementia 10/22/2010    Assessment & Plan: Large type III mixed hiatal hernia with symptoms suggesting obstruction;  Must weigh up risk of aspiration vs her underlying medical contitions.      Matt B. Daphine DeutscherMartin, MD, [facility]  [facility] [phone] beeper [phone]  02/21/2014 4:45 PM

## 2014-02-21 NOTE — Telephone Encounter (Signed)
Called patient daughter but no answer and no answering machine. Patient needs to be r/s due to admin time.

## 2014-02-21 NOTE — Patient Instructions (Addendum)
Have a family conference about proposed surgery and discuss with Dr. Chilton SiGreen.     Nissen Fundoplication Care After Please read the instructions outlined below and refer to this sheet for the next few weeks. These discharge instructions provide you with general information on caring for yourself after you leave the hospital. Your doctor may also give you specific instructions. While your treatment has been planned according to the most current medical practices available, unavoidable complications sometimes happen. If you have any problems or questions after discharge, please call your doctor. ACTIVITY  Take frequent rest periods throughout the day.  Take frequent walks throughout the day. This will help to prevent blood clots.  Continue to do your coughing and deep breathing exercises once you get home. This will help to prevent pneumonia.  No strenuous activities such as heavy lifting, pushing or pulling until after your follow-up visit with your doctor. Do not lift anything heavier than 10 pounds.  Talk with your caregiver about when you may return to work and your exercise routine.  You may shower 2 days after surgery. Pat incisions dry. Do not rub incisions with washcloth or towel.  Do not drive while taking prescription pain medication. NUTRITION  Continue with a liquid diet, or the diet you were directed to take, until your first follow-up visit with your surgeon.  Drink fluids (6-8 glasses a day).  Call your caregiver for persistent nausea (feeling sick to your stomach), vomiting, bloating or difficulty swallowing. ELIMINATION It is very important not to strain during bowel movements. If constipation should occur, you may:  Take a mild laxative (such as Milk of Magnesia).  Add fruit and bran to your diet.  Drink more fluids.  Call your caregiver if constipation is not relieved. FEVER If you feel feverish or have shaking chills, take your temperature. If it is 102 F  (38.9 C) or above, call your caregiver. The fever may mean there is an infection. PAIN CONTROL  If a prescription was given for a pain reliever, please follow your caregiver's directions.  Only take over-the-counter or prescription medicines for pain, discomfort, or fever as directed by your caregiver.  If the pain is not relieved by your medicine, becomes worse, or you have difficulty breathing, call your doctor. INCISION  It is normal for your cuts (incisions) from surgery to have a small amount of drainage for the first 1-2 days. Once the drainage has stopped, leave your incision(s) open to air.  Check your incision(s) and surrounding area daily for any redness, swelling, increased drainage or bleeding. If any of these are present or if the wound edges start to separate, call your doctor.  If you have small adhesive strips in place, they will peel and fall off. (If these strips are covered with a clear bandage, your doctor will tell you when to remove them.)  If you have staples, your caregiver will remove them at the follow-up appointment. Document Released: 02/11/2004 Document Revised: 09/12/2011 Document Reviewed: 05/17/2007 [facility] Patient Information 2015 [place] [place]. This information is not intended to replace advice given to you by your health care provider. Make sure you discuss any questions you have with your health care provider.

## 2014-03-04 ENCOUNTER — Encounter: Payer: Self-pay | Admitting: Adult Health

## 2014-03-04 ENCOUNTER — Ambulatory Visit (INDEPENDENT_AMBULATORY_CARE_PROVIDER_SITE_OTHER): Payer: Medicare Other | Admitting: Adult Health

## 2014-03-04 VITALS — BP 158/56 | HR 52

## 2014-03-04 DIAGNOSIS — R413 Other amnesia: Secondary | ICD-10-CM

## 2014-03-04 DIAGNOSIS — Z8673 Personal history of transient ischemic attack (TIA), and cerebral infarction without residual deficits: Secondary | ICD-10-CM

## 2014-03-04 MED ORDER — DONEPEZIL HCL 10 MG PO TABS: 10.0000 mg | ORAL_TABLET | Freq: Every day | ORAL | Status: DC

## 2014-03-04 NOTE — Progress Notes (Signed)
I have read the note, and I agree with the clinical assessment and plan.  Justin Meisenheimer KEITH   

## 2014-03-04 NOTE — Patient Instructions (Signed)

## 2014-03-04 NOTE — Progress Notes (Signed)
PATIENT: [name] DOB: 1925-07-31  REASON FOR VISIT: follow up HISTORY FROM: patient  HISTORY OF PRESENT ILLNESS: [name] is an 80 year old female with a history of cerebrovascular disease, gait disorder and dementia. She returns today for follow-up. She currently lives with her daughter. She no longer ambulates she is wheelchair bound. Daughter states that in the last 3-4 months she participates in more conversations. She requires maximal assistance with ADLs. The daughter has a caregiver in the home to help. She is able to feed herself. She has a good appetite but they just found out she has a large hiatal hernia (periespohageal hernia). She has to be careful how fast she eats and the quantity. If it lodges in her esohagus she was have pain, nausea and eventually vomit. She has seen a surgeon who can do laparoscopic surgery to fix there hernia. Family is currently weighing out the risks and benefits associated with the surgery. Denies any falls. She is unable to help with transfers. She continues to take Aricept and is tolerating it well.   REVIEW OF SYSTEMS: Full 14 system review of systems performed and notable only for:  Constitutional: Appetite change  Eyes: N/A Ear/Nose/Throat: N/A  Skin: N/A  Cardiovascular: N/A  Respiratory: N/A  Gastrointestinal: No pain, nausea, vomiting, incontinence of bowels Genitourinary: Incontinence of bladder Hematology/Lymphatic: N/A  Endocrine: N/A Musculoskeletal:N/A  Allergy/Immunology: N/A  Neurological: Memory loss, weakness Psychiatric: Decreased concentration Sleep: N/A   ALLERGIES: Allergies  Allergen Reactions  . Dilantin [Phenytoin Sodium Extended]   . Phenytoin Sodium Extended Itching  . Tegretol [Carbamazepine] Itching    itching    HOME MEDICATIONS: Outpatient Prescriptions Prior to Visit  Medication Sig Dispense Refill  . buPROPion (WELLBUTRIN XL) 150 MG 24 hr tablet Take 150 mg by mouth daily.      Marland Kitchen donepezil (ARICEPT)  10 MG tablet Take 1 tablet (10 mg total) by mouth daily.  30 tablet  11  . ferrous sulfate 325 (65 FE) MG tablet Take 325 mg by mouth daily with breakfast.      . furosemide (LASIX) 40 MG tablet Take 40 mg by mouth daily.       . metoprolol succinate (TOPROL-XL) 25 MG 24 hr tablet Take 12.5 mg by mouth daily.       Marland Kitchen omeprazole (PRILOSEC) 10 MG capsule Take 10 mg by mouth daily.      . potassium chloride SA (K-DUR,KLOR-CON) 20 MEQ tablet Take 20 mEq by mouth daily.         No facility-administered medications prior to visit.    PAST MEDICAL HISTORY: Past Medical History  Diagnosis Date  . Acute systolic heart failure   . Aortic regurgitation   . Hypertension   . Delirium, acute   . Anxiety   . Depression   . Muscular deconditioning   . Hypoxia     resolved  . TIA (transient ischemic attack)     "multiple" (06/25/2013)  . Anemia     history  . CHF (congestive heart failure) 2012  . GERD (gastroesophageal reflux disease)   . H/O hiatal hernia   . Degenerative joint disease     history  . Arthritis     "probably all over" (06/25/2013)  . Vascular dementia     PAST SURGICAL HISTORY: Past Surgical History  Procedure Laterality Date  . Total knee arthroplasty Left 2001  . Cholecystectomy  1990's  . Carpal tunnel release Bilateral 1980's  . Tubal ligation  1962  .  Cataract extraction w/ intraocular lens  implant, bilateral Bilateral 1990's    FAMILY HISTORY: Family History  Problem Relation Age of Onset  . Ovarian cancer Daughter   . Prostate cancer Brother     mets to bones  . Breast cancer Sister   . Heart attack Mother     SOCIAL HISTORY: History   Social History  . Marital Status: Widowed    Spouse Name: N/A    Number of Children: 4  . Years of Education: 12   Occupational History  . retired    Social History Main Topics  . Smoking status: Never Smoker   . Smokeless tobacco: Never Used  . Alcohol Use: No  . Drug Use: No  . Sexual Activity: No    Other Topics Concern  . Not on file   Social History Narrative   Patient is widowed with 4 children.   Patient is right handed.   Patient has a high school education.   Patient drinks 2 cups daily.      PHYSICAL EXAM  Filed Vitals:   03/04/14 1147  BP: 158/56  Pulse: 52   Cannot calculate BMI with a height equal to zero.  Generalized: Well developed, in no acute distress   Neurological examination  Mentation: Alert. Follows all commands intermittently. MMSE 21/30 Cranial nerve II-XII: Pupils were equal round reactive to light. Extraocular movements were full, visual field were full on confrontational test. Facial sensation and strength were normal.  Uvula tongue midline. Head turning and shoulder shrug  were normal and symmetric. Motor: The motor testing reveals 5 over 5 strength in the upper extremities and 4/5 strength in the lower extremities. Good symmetric motor tone is noted throughout.  Sensory: Sensory testing is intact to soft touch on all 4 extremities. No evidence of extinction is noted.  Coordination: Unable to complete Gait and station: Wheelchair bound Reflexes: Deep tendon reflexes are symmetric and normal bilaterally.     DIAGNOSTIC DATA (LABS, IMAGING, TESTING) - I reviewed patient records, labs, notes, testing and imaging myself where available.  Lab Results  Component Value Date   WBC 4.7 06/27/2013   HGB 8.4* 06/27/2013   HCT 27.0* 06/27/2013   MCV 87.7 06/27/2013   PLT 287 06/27/2013      Component Value Date/Time   NA 141 06/27/2013 0557   K 3.9 06/27/2013 0557   CL 103 06/27/2013 0557   CO2 31 06/27/2013 0557   GLUCOSE 94 06/27/2013 0557   BUN 19 06/27/2013 0557   CREATININE 0.77 06/27/2013 0557   CALCIUM 8.2* 06/27/2013 0557   PROT 6.1 08/11/2010 1533   ALBUMIN 3.3* 08/11/2010 1533   AST 18 08/11/2010 1533   ALT 10 08/11/2010 1533   ALKPHOS 39 08/11/2010 1533   BILITOT 0.6 08/11/2010 1533   GFRNONAA 74* 06/27/2013 0557   GFRAA 86*  06/27/2013 0557    Lab Results  Component Value Date   TSH 4.687* 08/12/2010      ASSESSMENT AND PLAN 80 y.o. year old female  has a past medical history of Acute systolic heart failure; Aortic regurgitation; Hypertension; Delirium, acute; Anxiety; Depression; Muscular deconditioning; Hypoxia; TIA (transient ischemic attack); Anemia; CHF (congestive heart failure) (2012); GERD (gastroesophageal reflux disease); H/O hiatal hernia; Degenerative joint disease; Arthritis; and Vascular dementia. here with:  1. Memory loss 2. History of CVA  Patient scored a 21/30 on MMSE. She currently takes Aricept. I will refill that today. Family is considering possible surgery to fix a hiatal hernia that's  affecting how much she eats. I explained the risks of anesthesia and a hospital stay in a patient with a memory disorder. Family verbalized understanding. States that they will weigh out the risks versus benefits of having the surgery. The patient should followup in 6 months or sooner if needed.   Butch Penny, MSN, NP-C 03/04/2014, 12:03 PM [facility] [address], [address] [place], [place] [address] [phone]  Note: This document was prepared with digital dictation and possible smart phrase technology. Any transcriptional errors that result from this process are unintentional.

## 2014-03-30 ENCOUNTER — Other Ambulatory Visit (INDEPENDENT_AMBULATORY_CARE_PROVIDER_SITE_OTHER): Payer: Self-pay | Admitting: Surgery

## 2014-04-10 ENCOUNTER — Encounter: Payer: Self-pay | Admitting: Podiatry

## 2014-04-10 ENCOUNTER — Ambulatory Visit (INDEPENDENT_AMBULATORY_CARE_PROVIDER_SITE_OTHER): Payer: Medicare Other | Admitting: Podiatry

## 2014-04-10 DIAGNOSIS — B351 Tinea unguium: Secondary | ICD-10-CM | POA: Diagnosis not present

## 2014-04-10 DIAGNOSIS — M79673 Pain in unspecified foot: Secondary | ICD-10-CM

## 2014-04-11 NOTE — Progress Notes (Signed)
Subjective:     Patient ID: Jenny PeakEdna Stanczak, female   DOB: 1926-02-18, 80 y.o.   MRN: [id number]  HPI patient presents with nail disease 1-5 both feet that are thick and she cannot take care of   Review of Systems     Objective:   Physical Exam Neurovascular status unchanged with thick yellow brittle nailbeds 1-5 both feet with pain noted upon palpation    Assessment:     Mycotic nail infection with pain 1-5 both feet    Plan:     Debris painful nailbeds 1-5 both feet with no iatrogenic bleeding noted

## 2014-05-14 ENCOUNTER — Other Ambulatory Visit: Payer: Self-pay | Admitting: Neurology

## 2014-05-21 ENCOUNTER — Encounter: Payer: Self-pay | Admitting: Neurology

## 2014-05-27 ENCOUNTER — Encounter: Payer: Self-pay | Admitting: Neurology

## 2014-05-28 ENCOUNTER — Other Ambulatory Visit: Payer: Self-pay | Admitting: Neurology

## 2014-07-24 ENCOUNTER — Other Ambulatory Visit: Payer: Medicare Other

## 2014-07-28 ENCOUNTER — Other Ambulatory Visit: Payer: Medicare Other

## 2014-09-02 ENCOUNTER — Encounter: Payer: Self-pay | Admitting: Neurology

## 2014-09-02 ENCOUNTER — Ambulatory Visit (INDEPENDENT_AMBULATORY_CARE_PROVIDER_SITE_OTHER): Payer: Medicare Other | Admitting: Neurology

## 2014-09-02 VITALS — BP 156/65 | HR 55

## 2014-09-02 DIAGNOSIS — F0390 Unspecified dementia without behavioral disturbance: Secondary | ICD-10-CM

## 2014-09-02 NOTE — Patient Instructions (Signed)

## 2014-09-02 NOTE — Progress Notes (Signed)
Reason for visit: Memory disorder  [name] is an 81 y.o. female  History of present illness:  [name] is an 81 year old right-handed white female with a history of a progressive dementing illness. The patient lives at home with her daughter and a caretaker. The patient is essentially nonambulatory, she can stand for transfers. The patient has not had any falls since last seen. The patient has had a reduction in her verbal output, she will spend the day reading the same Magazine again and again. She has a large hiatal hernia, but the family has learned to restrict the size of her meals to prevent vomiting. The patient has a hospital bed to keep her head elevated at night. She is able to feed herself, but otherwise requires assistance for all other activities of daily living. She comes to this office for an evaluation.  Past Medical History  Diagnosis Date  . Acute systolic heart failure   . Aortic regurgitation   . Hypertension   . Delirium, acute   . Anxiety   . Depression   . Muscular deconditioning   . Hypoxia     resolved  . TIA (transient ischemic attack)     "multiple" (06/25/2013)  . Anemia     history  . CHF (congestive heart failure) 2012  . GERD (gastroesophageal reflux disease)   . H/O hiatal hernia   . Degenerative joint disease     history  . Arthritis     "probably all over" (06/25/2013)  . Vascular dementia     Past Surgical History  Procedure Laterality Date  . Total knee arthroplasty Left 2001  . Cholecystectomy  1990's  . Carpal tunnel release Bilateral 1980's  . Tubal ligation  1962  . Cataract extraction w/ intraocular lens  implant, bilateral Bilateral 1990's    Family History  Problem Relation Age of Onset  . Ovarian cancer Daughter   . Prostate cancer Brother     mets to bones  . Breast cancer Sister   . Heart attack Mother     Social history:  reports that she has never smoked. She has never used smokeless tobacco. She reports that  she does not drink alcohol or use illicit drugs.    Allergies  Allergen Reactions  . Dilantin [Phenytoin Sodium Extended]   . Phenytoin Sodium Extended Itching  . Tegretol [Carbamazepine] Itching    itching    Medications:  Prior to Admission medications   Medication Sig Start Date End Date Taking? Authorizing Provider  buPROPion (WELLBUTRIN XL) 150 MG 24 hr tablet Take 150 mg by mouth daily.   Yes Historical Provider, MD  donepezil (ARICEPT) 10 MG tablet Take 1 tablet (10 mg total) by mouth daily. 03/04/14  Yes Butch Penny, NP  ferrous sulfate 325 (65 FE) MG tablet Take 325 mg by mouth daily with breakfast.   Yes Historical Provider, MD  furosemide (LASIX) 40 MG tablet Take 40 mg by mouth daily.    Yes Historical Provider, MD  metoprolol succinate (TOPROL-XL) 25 MG 24 hr tablet Take 12.5 mg by mouth daily.    Yes Historical Provider, MD  omeprazole (PRILOSEC) 10 MG capsule Take 10 mg by mouth daily.   Yes Historical Provider, MD  potassium chloride SA (K-DUR,KLOR-CON) 20 MEQ tablet Take 20 mEq by mouth daily.     Yes Historical Provider, MD    ROS:  Out of a complete 14 system review of symptoms, the patient complains only of the following symptoms, and  all other reviewed systems are negative.  Nausea, vomiting, incontinence of bowels  Inconstant bladder  Memory loss    Blood pressure 156/65, pulse 55, height 0' (0 m), weight 0 lb (0 kg).  Physical Exam  General: The patient is alert and cooperative at the time of the examination.  Skin: No significant peripheral edema is noted.   Neurologic Exam  Mental status: The Mini-Mental Status Examination done today shows a total score 20/30.  Cranial nerves: Facial symmetry is present. Speech is normal, no aphasia or dysarthria is noted. Extraocular movements are full. Visual fields are full.  Motor: The patient has good strength in all 4 extremities.  Sensory examination: Soft touch sensation is symmetric on the face,  arms, and legs.  Coordination: The patient has good finger-nose-finger and heel-to-shin bilaterally.The patient does have some apraxia with use of the extremities.  Gait and station: The patient requires assistance with standing, once up, she can bear weight, she is unable to initiate any walking, will lean to the right.  Reflexes: Deep tendon reflexes are symmetric.   MRI brain 06/25/13:  IMPRESSION: 1. No acute intracranial infarct or other process identified. 2. Advanced age-related atrophy and chronic microvascular ischemic Disease.  *Images of the MRI of the brain were reviewed, agree with written report.   Assessment/Plan:  1. Memory disturbance  2. Gait disturbance  3. Hiatal hernia  The patient continues to progress very slowly with her memory issues. Review of the MRI and CT scan of the brain evaluations suggests a moderate to severe level of small vessel disease. Some generalized atrophy is seen. The patient will continue on Aricept, she will follow-up in about 8-9 months.    [name]. Jenny Jaydan Chretien MD 09/02/2014 9:19 PM  [facility] [address] [place], [place] [id number]  Phone [phone] Fax [phone]

## 2014-10-20 ENCOUNTER — Ambulatory Visit (INDEPENDENT_AMBULATORY_CARE_PROVIDER_SITE_OTHER): Payer: Medicare Other | Admitting: Podiatry

## 2014-10-20 DIAGNOSIS — M79676 Pain in unspecified toe(s): Secondary | ICD-10-CM | POA: Diagnosis not present

## 2014-10-20 DIAGNOSIS — B351 Tinea unguium: Secondary | ICD-10-CM

## 2014-10-20 NOTE — Progress Notes (Signed)
   Subjective:    Patient ID: Jenny PeakEdna Johnson, female    DOB: Jul 22, 1925, 81 y.o.   MRN: [id number]  HPI Patient presents today complaining of painful toenails and requests toenail debridement   Review of Systems  All other systems reviewed and are negative.      Objective:   Physical Exam  The toenails are brittle, discolored, incurvated, elongated and tender to palpation 6-10      Assessment & Plan:   Assessment: Symptomatic onychomycoses 6-10  Plan: Debridement of toenails 10 without any bleeding  Reappoint 4 months

## 2014-10-22 IMAGING — RF DG UGI W/ HIGH DENSITY W/KUB
14 of 24 series · 14 of 24 positions shown · non-contrast
Comparison: CT abdomen and pelvis 11/21/2008

CLINICAL DATA: Dysphagia, regurgitation, acid reflux, pain after
eating.

EXAM:
UPPER GI SERIES WITH KUB
TECHNIQUE: After obtaining a scout radiograph a routine upper GI series was
performed using thin barium.
FLUOROSCOPY TIME:  2 min 10 seconds

[Series 1: run · 1 of 1 slices shown (1 of 14)]
[im 1/1]
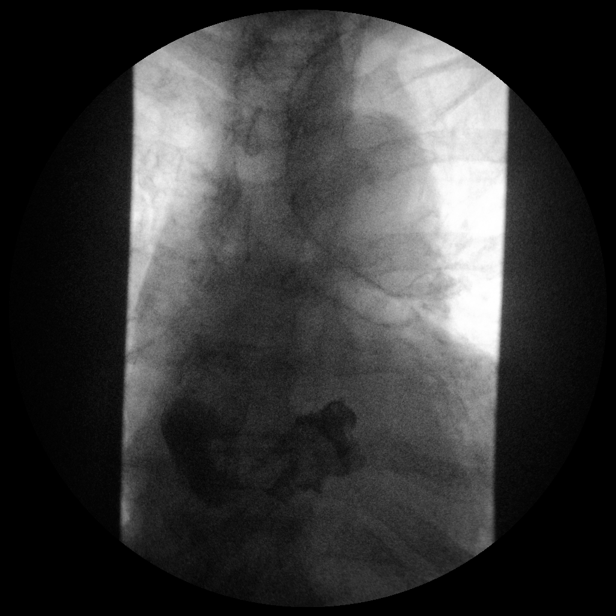

[Series 3: run · 1 of 1 slices shown (2 of 14)]
[im 1/1]
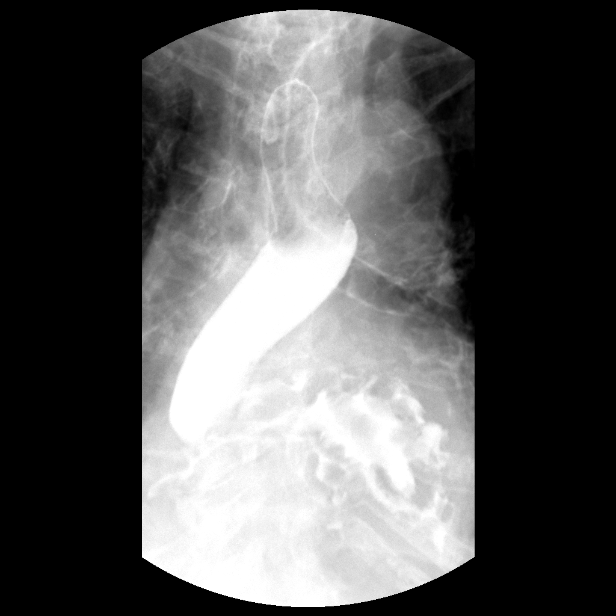

[Series 5: run · 1 of 1 slices shown (3 of 14)]
[im 1/1]
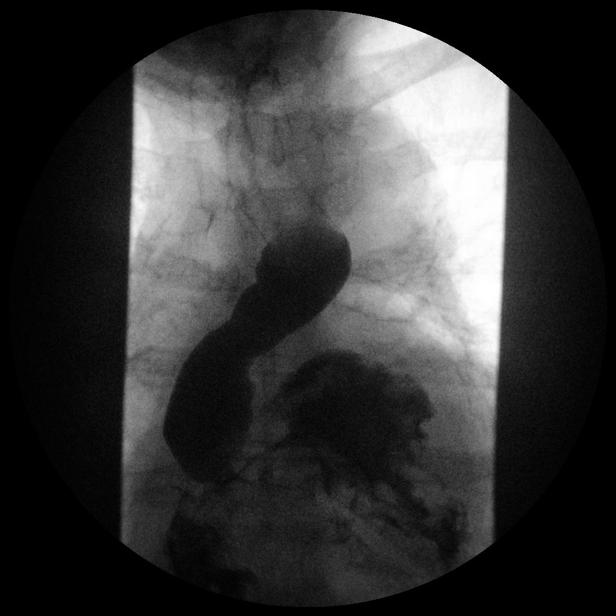

[Series 7: run · 1 of 1 slices shown (4 of 14)]
[im 1/1]
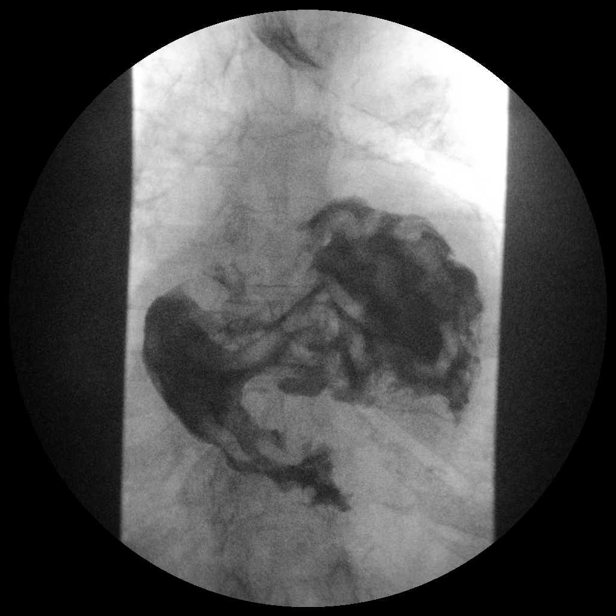

[Series 8: run · 1 of 1 slices shown (5 of 14)]
[im 1/1]
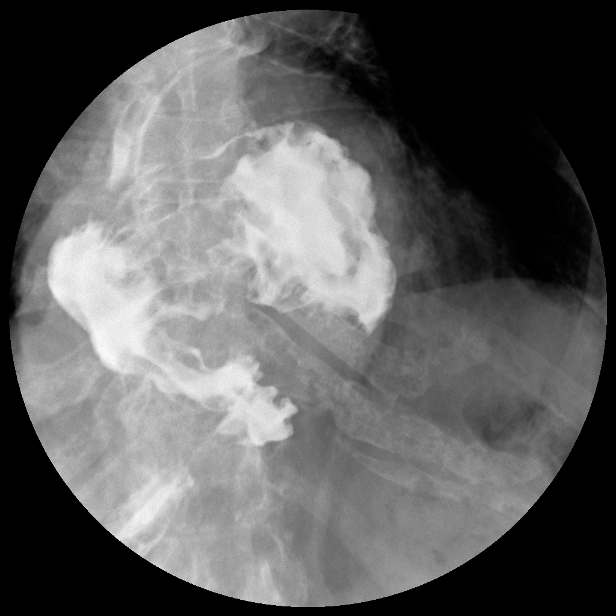

[Series 10: run · 1 of 1 slices shown (6 of 14)]
[im 1/1]
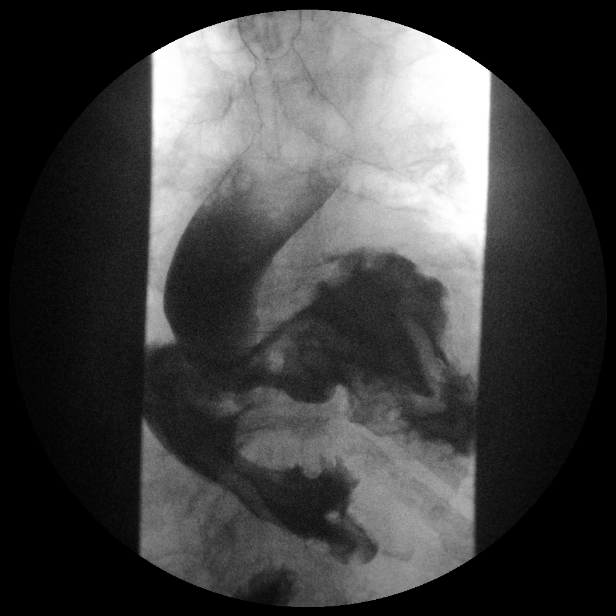

[Series 12: run · 1 of 1 slices shown (7 of 14)]
[im 1/1]
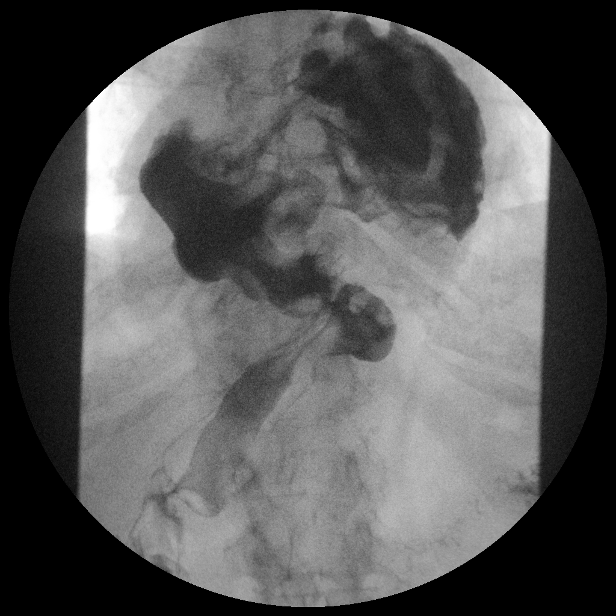

[Series 13: run · 1 of 1 slices shown (8 of 14)]
[im 1/1]
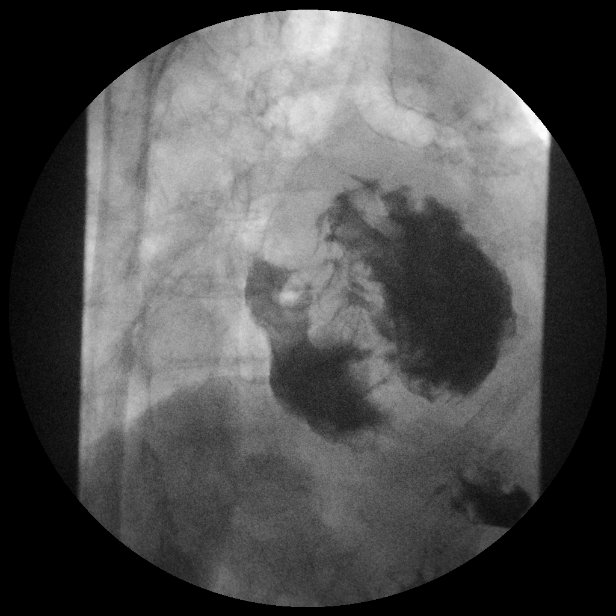

[Series 15: run · 1 of 1 slices shown (9 of 14)]
[im 1/1]
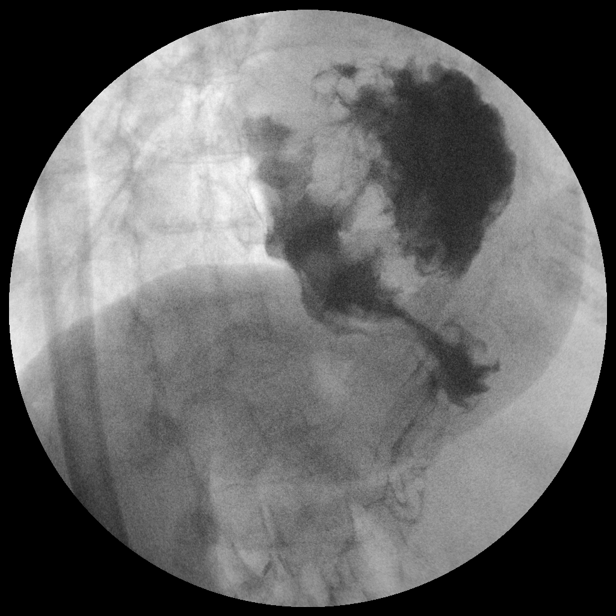

[Series 17: run · 1 of 1 slices shown (10 of 14)]
[im 1/1]
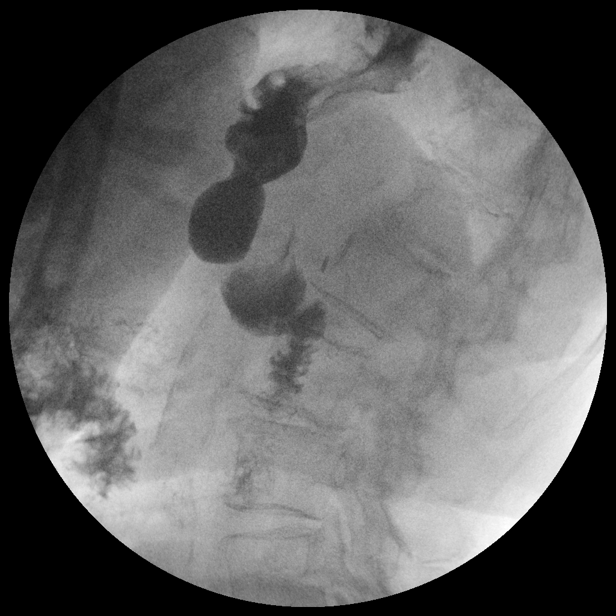

[Series 19: run · 1 of 1 slices shown (11 of 14)]
[im 1/1]
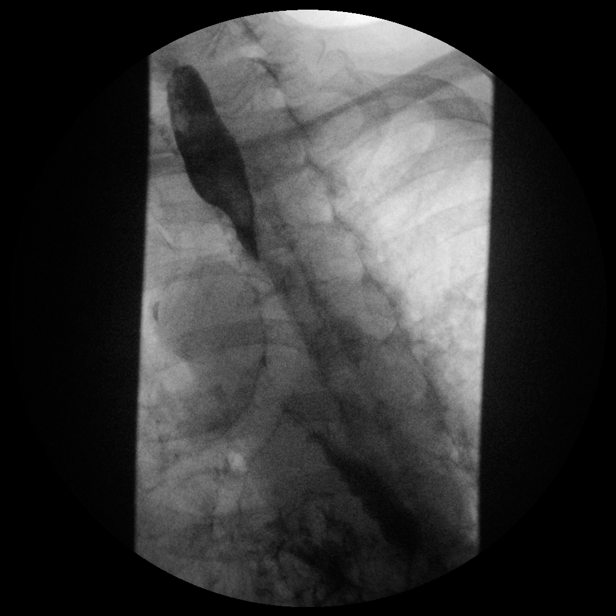

[Series 20: run · 1 of 1 slices shown (12 of 14)]
[im 1/1]
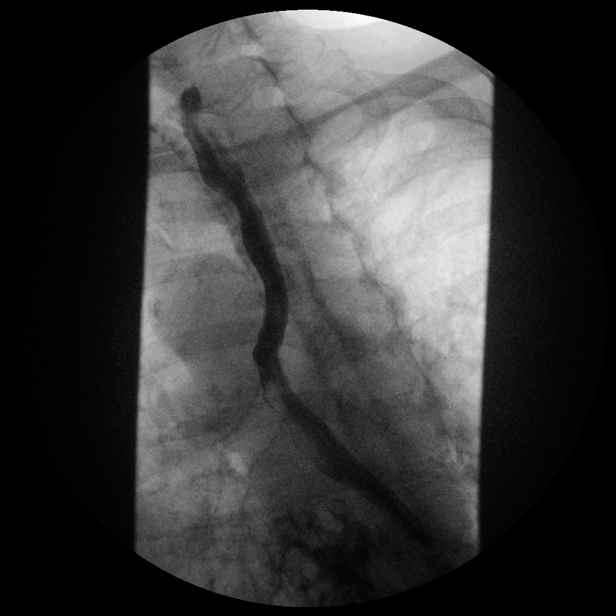

[Series 22: run · 1 of 1 slices shown (13 of 14)]
[im 1/1]
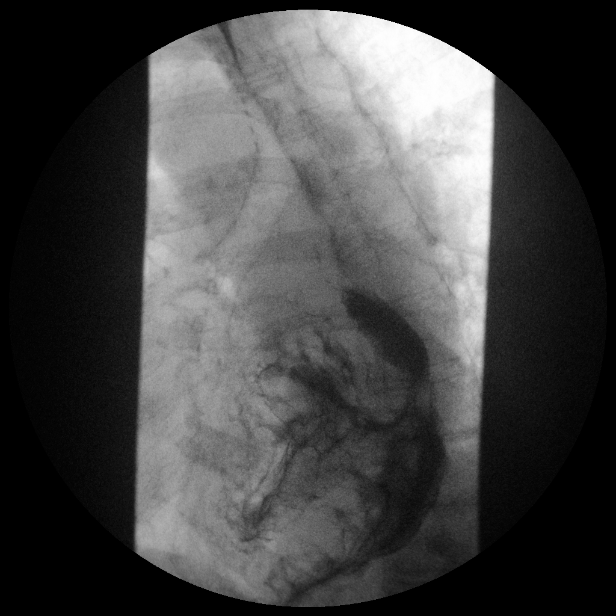

[Series 24: run · 1 of 1 slices shown (14 of 14)]
[im 1/1]
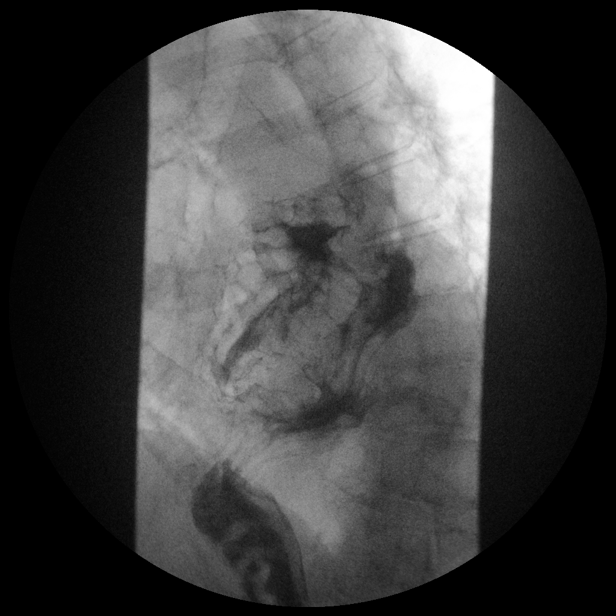

[14 of 24 positions shown; findings below may reference images not displayed]

FINDINGS: Examination was limited by diminished patient mobility and inability
to tolerate large swallows of contrast. Only a single contrast
examination was performed.

The esophagus is mildly tortuous and slightly patulous without
evidence of stricture. Contrast passed readily from the esophagus
into the stomach. The majority of the stomach (approximately 75%)
was located in the lower chest with a configuration which appeared
to represent predominantly a paraesophageal hernia with partial
organoaxial rotation. The stomach emptied readily into the duodenum,
which appeared normal in course. A small amount of spontaneous
gastroesophageal reflux was observed during examination.
IMPRESSION: 1. Large, predominantly paraesophageal hiatal hernia with some
degree of organoaxial rotation. No evidence esophageal or gastric
obstruction.
2. Small amount gastroesophageal reflux.

## 2015-02-23 ENCOUNTER — Ambulatory Visit: Payer: Medicare Other | Admitting: Podiatry

## 2015-03-13 ENCOUNTER — Other Ambulatory Visit: Payer: Self-pay | Admitting: Adult Health

## 2015-03-18 ENCOUNTER — Ambulatory Visit (INDEPENDENT_AMBULATORY_CARE_PROVIDER_SITE_OTHER): Payer: Medicare Other | Admitting: Podiatry

## 2015-03-18 ENCOUNTER — Encounter: Payer: Self-pay | Admitting: Podiatry

## 2015-03-18 DIAGNOSIS — B351 Tinea unguium: Secondary | ICD-10-CM | POA: Diagnosis not present

## 2015-03-18 DIAGNOSIS — M79676 Pain in unspecified toe(s): Secondary | ICD-10-CM | POA: Diagnosis not present

## 2015-03-19 NOTE — Progress Notes (Signed)
Patient ID: [name], female   DOB: June 22, 1926, 81 y.o.   MRN: [id number]  Subjective: This patient presents today complaining of painful toenails and requests toenail debridement. This is a scheduled visit.  Objective: Patient's daughter present to treatment room Patient is seated in a wheelchair not able to transfer The toenails are elongated, dystrophic, discolored, incurvated and tender direct palpation 6-10  Assessment: Symptomatic onychomycoses 6-10  Plan: Debridement toenails 10 mechanically and electrically without any bleeding  Reappoint 4 months

## 2015-05-05 ENCOUNTER — Encounter: Payer: Self-pay | Admitting: Neurology

## 2015-05-05 ENCOUNTER — Ambulatory Visit (INDEPENDENT_AMBULATORY_CARE_PROVIDER_SITE_OTHER): Payer: Medicare Other | Admitting: Neurology

## 2015-05-05 VITALS — BP 158/60 | HR 58 | Ht 59.0 in

## 2015-05-05 DIAGNOSIS — R413 Other amnesia: Secondary | ICD-10-CM | POA: Diagnosis not present

## 2015-05-05 MED ORDER — DONEPEZIL HCL 10 MG PO TABS: ORAL_TABLET | ORAL | Status: DC

## 2015-05-05 NOTE — Patient Instructions (Signed)

## 2015-05-05 NOTE — Progress Notes (Signed)
Reason for visit: Memory disturbance  [name] is an 81 y.o. female  History of present illness:  [name] is an 81 year old right-handed white female with a history of a progressive memory disturbance. The patient has had MRI of the brain evaluation previously showing extensive small vessel changes with severe cortical atrophy. The patient is on Aricept taking 10 mg daily. The patient is having issues with a severe hiatal hernia, and severe reflux issues. She will be seeing Dr. Daphine DeutscherMartin in the near future for possible surgical considerations. The patient is able to feed herself, but she requires assistance with virtually all other activities of daily living including brushing her teeth, bathing, dressing, and with management of the bowels and the bladder. The patient has a good appetite, but she oftentimes will reflux and vomit after eating, and at nighttime. The head of the bed has to stay elevated. The patient continues to progress with her cognitive issues. The patient still recognizes family members, but overall her verbal output has declined.  Past Medical History  Diagnosis Date  . Acute systolic heart failure (HCC)   . Aortic regurgitation   . Hypertension   . Delirium, acute   . Anxiety   . Depression   . Muscular deconditioning   . Hypoxia     resolved  . TIA (transient ischemic attack)     "multiple" (06/25/2013)  . Anemia     history  . CHF (congestive heart failure) (HCC) 2012  . GERD (gastroesophageal reflux disease)   . H/O hiatal hernia   . Degenerative joint disease     history  . Arthritis     "probably all over" (06/25/2013)  . Vascular dementia     Past Surgical History  Procedure Laterality Date  . Total knee arthroplasty Left 2001  . Cholecystectomy  1990's  . Carpal tunnel release Bilateral 1980's  . Tubal ligation  1962  . Cataract extraction w/ intraocular lens  implant, bilateral Bilateral 1990's    Family History  Problem Relation Age of  Onset  . Ovarian cancer Daughter   . Prostate cancer Brother     mets to bones  . Breast cancer Sister   . Heart attack Mother     Social history:  reports that she has never smoked. She has never used smokeless tobacco. She reports that she does not drink alcohol or use illicit drugs.    Allergies  Allergen Reactions  . Dilantin [Phenytoin Sodium Extended]   . Phenytoin Sodium Extended Itching  . Tegretol [Carbamazepine] Itching    itching    Medications:  Prior to Admission medications   Medication Sig Start Date End Date Taking? Authorizing Provider  buPROPion (WELLBUTRIN XL) 150 MG 24 hr tablet Take 150 mg by mouth daily.   Yes Historical Provider, MD  donepezil (ARICEPT) 10 MG tablet TAKE ONE (1) TABLET BY MOUTH EVERY DAY 03/13/15  Yes Butch PennyMegan Millikan, NP  ferrous sulfate 325 (65 FE) MG tablet Take 325 mg by mouth daily with breakfast.   Yes Historical Provider, MD  furosemide (LASIX) 40 MG tablet Take 40 mg by mouth daily.    Yes Historical Provider, MD  metoprolol succinate (TOPROL-XL) 25 MG 24 hr tablet Take 12.5 mg by mouth daily.    Yes Historical Provider, MD  omeprazole (PRILOSEC) 10 MG capsule Take 10 mg by mouth daily.   Yes Historical Provider, MD  potassium chloride SA (K-DUR,KLOR-CON) 20 MEQ tablet Take 20 mEq by mouth daily.  Yes Historical Provider, MD    ROS:  Out of a complete 14 system review of symptoms, the patient complains only of the following symptoms, and all other reviewed systems are negative.  Runny nose Choking Vomiting Incontinence of the bladder Memory loss, weakness Confusion, decreased concentration, depression  Blood pressure 158/60, pulse 58, height  (1.499 m).  Physical Exam  General: The patient is alert and cooperative at the time of the examination.  Skin: No significant peripheral edema is noted.   Neurologic Exam  Mental status: The patient is alert and oriented x 1 at the time of examination (oriented to person  only). Mini-Mental Status Examination done today shows a total score of 9/30. The patient is able to name 3 animals in 30 seconds.   Cranial nerves: Facial symmetry is present. Speech is normal, no aphasia or dysarthria is noted. Extraocular movements are full. Visual fields are full.  Motor: The patient has good strength in all 4 extremities, but the patient has significant apraxia with the use of the extremities.  Sensory examination: Soft touch sensation is symmetric on the face, arms, and legs.  Coordination: The patient has good finger-nose-finger and heel-to-shin bilaterally.  Gait and station: The patient requires some assistance with standing. Once up, gait is wide-based, unsteady, the patient requires some support with walking, is able to take a few steps.  Reflexes: Deep tendon reflexes are symmetric.   Assessment/Plan:  1. Dementia  2. Gait disorder  The patient will remain on Aricept. She was given a prescription for the medication, she will follow-up in 8 months for reevaluation. The patient has had a dramatic change in her Mini-Mental status score from the last visit.  Marlan Palau MD 05/05/2015 6:48 PM  [facility] [address] [place], [place] [address]  Phone [phone] Fax [phone]

## 2015-07-15 ENCOUNTER — Ambulatory Visit: Payer: Medicare Other | Admitting: Podiatry

## 2015-07-22 ENCOUNTER — Encounter: Payer: Self-pay | Admitting: Podiatry

## 2015-07-22 ENCOUNTER — Ambulatory Visit (INDEPENDENT_AMBULATORY_CARE_PROVIDER_SITE_OTHER): Payer: Medicare Other | Admitting: Podiatry

## 2015-07-22 DIAGNOSIS — M79676 Pain in unspecified toe(s): Secondary | ICD-10-CM | POA: Diagnosis not present

## 2015-07-22 DIAGNOSIS — B351 Tinea unguium: Secondary | ICD-10-CM

## 2015-07-23 NOTE — Progress Notes (Signed)
Patient ID: [name], female   DOB: Nov 12, 1925, 82 y.o.   MRN: [id number]  Subjective: This patient presents today complaining of thickened and elongated toenails and requests toenail debridement The patient's daughter is present in the treatment room today  Objective: Patient is seated in a wheelchair and unable to transfer Open skin lesions bilaterally The toenails are incurvated, discolored with dystrophic changes and tender direct palpation 6-10   Assessment: Symptomatic onychomycoses 6-10  Plan: Debridement toenails 6-10 mechanically and electronically without any bleeding  Reappoint 3 month

## 2015-08-17 ENCOUNTER — Other Ambulatory Visit: Payer: Self-pay | Admitting: Neurology

## 2015-10-28 ENCOUNTER — Encounter: Payer: Self-pay | Admitting: Podiatry

## 2015-10-28 ENCOUNTER — Ambulatory Visit (INDEPENDENT_AMBULATORY_CARE_PROVIDER_SITE_OTHER): Payer: Medicare Other | Admitting: Podiatry

## 2015-10-28 DIAGNOSIS — B351 Tinea unguium: Secondary | ICD-10-CM | POA: Diagnosis not present

## 2015-10-28 DIAGNOSIS — M79676 Pain in unspecified toe(s): Secondary | ICD-10-CM | POA: Diagnosis not present

## 2015-10-29 NOTE — Progress Notes (Signed)
Patient ID: [name], female   DOB: June 24, 1926, 82 y.o.   MRN: [id number]  Subjective: This patient presents today complaining of thickened and elongated toenails and requests toenail debridement The patient's daughter is present in the treatment room today  Objective: Patient is seated in a wheelchair and unable to transfer No open skin lesions bilaterally The toenails are incurvated, discolored with dystrophic changes and tender direct palpation 6-10   Assessment: Symptomatic onychomycoses 6-10  Plan: Debridement toenails 6-10 mechanically and electronically without any bleeding  Reappoint 3 month

## 2016-01-04 ENCOUNTER — Ambulatory Visit: Payer: Medicare Other | Admitting: Adult Health

## 2016-01-12 ENCOUNTER — Encounter: Payer: Self-pay | Admitting: Adult Health

## 2016-01-12 ENCOUNTER — Ambulatory Visit (INDEPENDENT_AMBULATORY_CARE_PROVIDER_SITE_OTHER): Payer: Medicare Other | Admitting: Adult Health

## 2016-01-12 VITALS — BP 128/62 | HR 86 | Resp 20

## 2016-01-12 DIAGNOSIS — R413 Other amnesia: Secondary | ICD-10-CM

## 2016-01-12 NOTE — Progress Notes (Signed)
I have read the note, and I agree with the clinical assessment and plan.  WILLIS,CHARLES KEITH   

## 2016-01-12 NOTE — Patient Instructions (Signed)
Continue Aricept Memory score stable If your symptoms worsen or you develop new symptoms please let us know.   

## 2016-01-12 NOTE — Progress Notes (Signed)
PATIENT: [name] DOB: 1925-07-31  REASON FOR VISIT: follow up-memory disturbance HISTORY FROM: patient  HISTORY OF PRESENT ILLNESS: [name] is an 82 year old female with a history of memory disturbance. She returns today for follow-up. The patient remains on Aricept and is tolerating it well. She did follow-up with Dr. Daphine Deutscher for reflux and hiatal hernia and it was determined that she is not a good candidate for surgery. The patient lives with her daughter. She requires assistance with most ADLs. She is able to feed herself- daughter states that she is sleeping well. Denies any hallucinations. She states that when her reflux is severe the patient tends to throw up and does not feel well --will also have chest pain. Daughter also reports that she does not help much with transfers. She has an aide that helps her during the day and they are considering having someone come at night to help her put her to bed. At this time they do not want to place her in a facility. She returns today for an evaluation.  HISTORY 05/05/15 (WILLIS): [name] is an 82 year old right-handed white female with a history of a progressive memory disturbance. The patient has had MRI of the brain evaluation previously showing extensive small vessel changes with severe cortical atrophy. The patient is on Aricept taking 10 mg daily. The patient is having issues with a severe hiatal hernia, and severe reflux issues. She will be seeing Dr. Daphine Deutscher in the near future for possible surgical considerations. The patient is able to feed herself, but she requires assistance with virtually all other activities of daily living including brushing her teeth, bathing, dressing, and with management of the bowels and the bladder. The patient has a good appetite, but she oftentimes will reflux and vomit after eating, and at nighttime. The head of the bed has to stay elevated. The patient continues to progress with her cognitive issues. The patient  still recognizes family members, but overall her verbal output has declined.  REVIEW OF SYSTEMS: Out of a complete 14 system review of symptoms, the patient complains only of the following symptoms, and all other reviewed systems are negative.  See history of present illness ALLERGIES: Allergies  Allergen Reactions  . Dilantin [Phenytoin Sodium Extended]   . Phenytoin Sodium Extended Itching  . Tegretol [Carbamazepine] Itching    itching    HOME MEDICATIONS: Outpatient Prescriptions Prior to Visit  Medication Sig Dispense Refill  . buPROPion (WELLBUTRIN XL) 150 MG 24 hr tablet TAKE ONE (1) TABLET BY MOUTH EVERY DAY 90 tablet 3  . donepezil (ARICEPT) 10 MG tablet TAKE ONE (1) TABLET BY MOUTH EVERY DAY 90 tablet 2  . ferrous sulfate 325 (65 FE) MG tablet Take 325 mg by mouth daily with breakfast.    . furosemide (LASIX) 40 MG tablet Take 40 mg by mouth daily.     . metoprolol succinate (TOPROL-XL) 25 MG 24 hr tablet Take 12.5 mg by mouth daily.     Marland Kitchen omeprazole (PRILOSEC) 10 MG capsule Take 10 mg by mouth daily.    . potassium chloride SA (K-DUR,KLOR-CON) 20 MEQ tablet Take 20 mEq by mouth daily.       No facility-administered medications prior to visit.    PAST MEDICAL HISTORY: Past Medical History  Diagnosis Date  . Acute systolic heart failure (HCC)   . Aortic regurgitation   . Hypertension   . Delirium, acute   . Anxiety   . Depression   . Muscular deconditioning   .  Hypoxia     resolved  . TIA (transient ischemic attack)     "multiple" (06/25/2013)  . Anemia     history  . CHF (congestive heart failure) (HCC) 2012  . GERD (gastroesophageal reflux disease)   . H/O hiatal hernia   . Degenerative joint disease     history  . Arthritis     "probably all over" (06/25/2013)  . Vascular dementia     PAST SURGICAL HISTORY: Past Surgical History  Procedure Laterality Date  . Total knee arthroplasty Left 2001  . Cholecystectomy  1990's  . Carpal tunnel release  Bilateral 1980's  . Tubal ligation  1962  . Cataract extraction w/ intraocular lens  implant, bilateral Bilateral 1990's    FAMILY HISTORY: Family History  Problem Relation Age of Onset  . Ovarian cancer Daughter   . Prostate cancer Brother     mets to bones  . Breast cancer Sister   . Heart attack Mother     SOCIAL HISTORY: Social History   Social History  . Marital Status: Widowed    Spouse Name: N/A  . Number of Children: 4  . Years of Education: 12   Occupational History  . retired    Social History Main Topics  . Smoking status: Never Smoker   . Smokeless tobacco: Never Used  . Alcohol Use: No  . Drug Use: No  . Sexual Activity: No   Other Topics Concern  . Not on file   Social History Narrative   Patient is widowed with 4 children.   Patient is right handed.   Patient has a high school education.   Patient drinks 2 cups daily.      PHYSICAL EXAM  Filed Vitals:   01/12/16 1407  BP: 128/62  Pulse: 86  Resp: 20   There is no weight on file to calculate BMI. MMSE - Mini Mental State Exam 01/12/2016 05/05/2015 09/02/2014  Orientation to time 2 0 0  Orientation to Place Registration 3 0 3  Attention/ Calculation 2 0 5  Recall 0 0 0  Language- name 2 objects Language- repeat Language- follow 3 step command Language- read & follow direction Write a sentence 0 0 0  Copy design 0 0 1  Total score Generalized: Well developed, in no acute distress   Neurological examination  Mentation: Alert. Follows all commands speech and language fluent Cranial nerve II-XII: Pupils were equal round reactive to light. Extraocular movements were full, visual field were full on confrontational test. Facial sensation and strength were normal. Uvula tongue midline. Head turning and shoulder shrug  were normal and symmetric. Motor: The motor testing reveals 5 over 5 strength of all 4 extremities. Good symmetric motor tone is  noted throughout.  Sensory: Sensory testing is intact to soft touch on all 4 extremities. No evidence of extinction is noted.  Coordination: Cerebellar testing reveals good finger-nose-finger and heel-to-shin bilaterally.  Gait and station: Gait is normal. Tandem gait is normal. Romberg is negative. No drift is seen.  Reflexes: Deep tendon reflexes are symmetric and normal bilaterally.   DIAGNOSTIC DATA (LABS, IMAGING, TESTING) - I reviewed patient records, labs, notes, testing and imaging myself where available.       ASSESSMENT AND PLAN 82 y.o. year old female  has a past medical history of Acute systolic heart failure (HCC); Aortic  regurgitation; Hypertension; Delirium, acute; Anxiety; Depression; Muscular deconditioning; Hypoxia; TIA (transient ischemic attack); Anemia; CHF (congestive heart failure) (HCC) (2012); GERD (gastroesophageal reflux disease); H/O hiatal hernia; Degenerative joint disease; Arthritis; and Vascular dementia. here with:  1. Memory disturbance  The patient's memory score has improved today. Her MMSE is 17 out of 30 was previously 9 out of 30. She will remain on Aricept 10 mg daily. Patient and her family advised that if her symptoms worsen or she develops any new symptoms they should let us know. She will follow-up in 6 months or sooner if needed.    Butch PennyMegan Ahyana Skillin, MSN, NP-C 01/12/2016, 2:15 PM [facility] [address], [address] [place], [place] [address] [phone]

## 2016-02-03 ENCOUNTER — Ambulatory Visit: Payer: Medicare Other | Admitting: Podiatry

## 2016-02-23 ENCOUNTER — Other Ambulatory Visit: Payer: Self-pay | Admitting: *Deleted

## 2016-02-23 MED ORDER — DONEPEZIL HCL 10 MG PO TABS: ORAL_TABLET | ORAL | 2 refills | Status: DC

## 2016-02-24 ENCOUNTER — Ambulatory Visit (INDEPENDENT_AMBULATORY_CARE_PROVIDER_SITE_OTHER): Payer: Medicare Other | Admitting: Podiatry

## 2016-02-24 ENCOUNTER — Encounter: Payer: Self-pay | Admitting: Podiatry

## 2016-02-24 DIAGNOSIS — M79676 Pain in unspecified toe(s): Secondary | ICD-10-CM | POA: Diagnosis not present

## 2016-02-24 DIAGNOSIS — B351 Tinea unguium: Secondary | ICD-10-CM

## 2016-02-25 NOTE — Progress Notes (Signed)
Patient ID: [name], female   DOB: Feb 11, 1926, 82 y.o.   MRN: [id number]     Subjective: This patient presents today complaining of thickened and elongated toenails and requests toenail debridement The patient's daughter is present in the treatment room today  Objective: Patient is seated in a wheelchair and able to transfer DP pulse 1/4 right 2/4 left PT pulses 1/4 bilaterally HAV right Hammertoe 2-5 bilaterally No open skin lesions bilaterally The toenails are incurvated, discolored with dystrophic changes and tender direct palpation 6-10   Assessment: Symptomatic onychomycoses 6-10  Plan: Debridement toenails 6-10 mechanically and electronically without any bleeding  Reappoint 3 month

## 2016-06-01 ENCOUNTER — Ambulatory Visit (INDEPENDENT_AMBULATORY_CARE_PROVIDER_SITE_OTHER): Payer: Medicare Other | Admitting: Podiatry

## 2016-06-01 ENCOUNTER — Encounter: Payer: Self-pay | Admitting: Podiatry

## 2016-06-01 VITALS — BP 168/88 | HR 65 | Resp 18

## 2016-06-01 DIAGNOSIS — M79676 Pain in unspecified toe(s): Secondary | ICD-10-CM

## 2016-06-01 DIAGNOSIS — B351 Tinea unguium: Secondary | ICD-10-CM

## 2016-06-02 NOTE — Progress Notes (Signed)
Patient ID: [name], female   DOB: September 11, 1925, 82 y.o.   MRN: [id number]     Subjective: This patient presents today and the patient's daughter says that her mother is complaining of thickened and elongated toenails and requests toenail debridement The patient's daughter is present in the treatment room today  Objective: Patient is seated in a wheelchair and not  able to transfer Patient confused DP pulse 1/4 right 2/4 left PT pulses 1/4 bilaterally HAV right Patient not able to respond to 10 g monofilament wire or tuning fork Hammertoe 2-5 bilaterally No open skin lesions bilaterally The toenails are incurvated, discolored with dystrophic changes and tender direct palpation 6-10   Assessment: Symptomatic onychomycoses 6-10  Plan: Debridement toenails 6-10 mechanically and electronically without any bleeding  Reappoint 3 month

## 2016-07-14 ENCOUNTER — Ambulatory Visit: Payer: Medicare Other | Admitting: Adult Health

## 2016-08-16 ENCOUNTER — Ambulatory Visit (INDEPENDENT_AMBULATORY_CARE_PROVIDER_SITE_OTHER): Payer: Medicare Other | Admitting: Adult Health

## 2016-08-16 ENCOUNTER — Encounter: Payer: Self-pay | Admitting: Adult Health

## 2016-08-16 VITALS — Ht 59.0 in

## 2016-08-16 DIAGNOSIS — R413 Other amnesia: Secondary | ICD-10-CM

## 2016-08-16 NOTE — Patient Instructions (Signed)
Memory score is stable Continue Aricept If your symptoms worsen or you develop new symptoms please let us know.   

## 2016-08-16 NOTE — Progress Notes (Signed)
PATIENT: [name] DOB: Apr 11, 1926  REASON FOR VISIT: follow up- memory disturbance HISTORY FROM: patient  HISTORY OF PRESENT ILLNESS: [name] is an 83 year old female with a history of memory disturbance. She returns today for follow-up. She is with her daughter today. Daughter reports that overall things are about the same. Patient requires assistance with ADLs. She is able to feed herself. According to the daughter she is sleeping well. Reports that she does have a good appetite however she does have a hiatal hernia and they have to chop her food and give her very small amounts. They have noticed that she seems a little more agitated however this is manageable. She continues on Aricept 10 mg at bedtime and continues to tolerate this well. She returns today for an evaluation.  HISTORY 01/12/16: [name] is an 83 year old female with a history of memory disturbance. She returns today for follow-up. The patient remains on Aricept and is tolerating it well. She did follow-up with Dr. Daphine Deutscher for reflux and hiatal hernia and it was determined that she is not a good candidate for surgery. The patient lives with her daughter. She requires assistance with most ADLs. She is able to feed herself- daughter states that she is sleeping well. Denies any hallucinations. She states that when her reflux is severe the patient tends to throw up and does not feel well --will also have chest pain. Daughter also reports that she does not help much with transfers. She has an aide that helps her during the day and they are considering having someone come at night to help her put her to bed. At this time they do not want to place her in a facility. She returns today for an evaluation.   REVIEW OF SYSTEMS: Out of a complete 14 system review of symptoms, the patient complains only of the following symptoms, and all other reviewed systems are negative.  Nausea, vomiting, incontinence of bowels, hearing loss, incontinence  of bladder, memory loss, weakness, decreased concentration, depression  ALLERGIES: Allergies  Allergen Reactions  . Dilantin [Phenytoin Sodium Extended]   . Phenytoin Sodium Extended Itching  . Tegretol [Carbamazepine] Itching    itching    HOME MEDICATIONS: Outpatient Medications Prior to Visit  Medication Sig Dispense Refill  . buPROPion (WELLBUTRIN XL) 150 MG 24 hr tablet TAKE ONE (1) TABLET BY MOUTH EVERY DAY 90 tablet 3  . donepezil (ARICEPT) 10 MG tablet TAKE ONE (1) TABLET BY MOUTH EVERY DAY 90 tablet 2  . ferrous sulfate 325 (65 FE) MG tablet Take 325 mg by mouth daily with breakfast.    . furosemide (LASIX) 40 MG tablet Take 40 mg by mouth daily.     . metoprolol succinate (TOPROL-XL) 25 MG 24 hr tablet Take 12.5 mg by mouth daily.     Marland Kitchen omeprazole (PRILOSEC) 10 MG capsule Take 10 mg by mouth daily.    . potassium chloride SA (K-DUR,KLOR-CON) 20 MEQ tablet Take 20 mEq by mouth daily.       No facility-administered medications prior to visit.     PAST MEDICAL HISTORY: Past Medical History:  Diagnosis Date  . Acute systolic heart failure (HCC)   . Anemia    history  . Anxiety   . Aortic regurgitation   . Arthritis    "probably all over" (06/25/2013)  . CHF (congestive heart failure) (HCC) 2012  . Degenerative joint disease    history  . Delirium, acute   . Depression   . GERD (gastroesophageal  reflux disease)   . H/O hiatal hernia   . Hypertension   . Hypoxia    resolved  . Muscular deconditioning   . TIA (transient ischemic attack)    "multiple" (06/25/2013)  . Vascular dementia     PAST SURGICAL HISTORY: Past Surgical History:  Procedure Laterality Date  . CARPAL TUNNEL RELEASE Bilateral 1980's  . CATARACT EXTRACTION W/ INTRAOCULAR LENS  IMPLANT, BILATERAL Bilateral 1990's  . CHOLECYSTECTOMY  1990's  . TOTAL KNEE ARTHROPLASTY Left 2001  . TUBAL LIGATION  1962    FAMILY HISTORY: Family History  Problem Relation Age of Onset  . Ovarian cancer  Daughter   . Prostate cancer Brother     mets to bones  . Breast cancer Sister   . Heart attack Mother     SOCIAL HISTORY: Social History   Social History  . Marital status: Widowed    Spouse name: N/A  . Number of children: 4  . Years of education: 12   Occupational History  . retired    Social History Main Topics  . Smoking status: Never Smoker  . Smokeless tobacco: Never Used  . Alcohol use No  . Drug use: No  . Sexual activity: No   Other Topics Concern  . Not on file   Social History Narrative   Patient is widowed with 4 children.   Patient is right handed.   Patient has a high school education.   Patient drinks 2 cups daily.      PHYSICAL EXAM  Vitals:   08/16/16 1320  Height: 4\' 11"  (1.499 m)   There is no height or weight on file to calculate BMI.   MMSE - Mini Mental State Exam 08/16/2016 01/12/2016 05/05/2015  Orientation to time 0 2 0  Orientation to Place 5 3 3   Registration 3 3 0  Attention/ Calculation 5 2 0  Recall 0 0 0  Language- name 2 objects 2 2 2   Language- repeat 1 1 1   Language- follow 3 step command 3 3 2   Language- read & follow direction 1 1 1   Write a sentence 0 0 0  Copy design 0 0 0  Total score 20 17 9      Generalized: Well developed, in no acute distress   Neurological examination  Mentation: Alert. Follows all commands. Limited conversation  Cranial nerve II-XII: Pupils were equal round reactive to light. Extraocular movements were full, visual field were full on confrontational test. Facial sensation and strength were normal. Uvula tongue midline. Head turning and shoulder shrug  were normal and symmetric. Motor: The motor testing reveals 5 over 5 strength of all 4 extremities. Good symmetric motor tone is noted throughout.  Sensory: Sensory testing is intact to soft touch on all 4 extremities. No evidence of extinction is noted.  Coordination: Cerebellar testing reveals good finger-nose-finger bilaterally, some  difficulty with heel-to-shin bilaterally d/t mobility Gait and station: patient is in wheelchair   DIAGNOSTIC DATA (LABS, IMAGING, TESTING) - I reviewed patient records, labs, notes, testing and imaging myself where available.  ASSESSMENT AND PLAN 83 y.o. year old female  has a past medical history of Acute systolic heart failure (HCC); Anemia; Anxiety; Aortic regurgitation; Arthritis; CHF (congestive heart failure) (HCC) (2012); Degenerative joint disease; Delirium, acute; Depression; GERD (gastroesophageal reflux disease); H/O hiatal hernia; Hypertension; Hypoxia; Muscular deconditioning; TIA (transient ischemic attack); and Vascular dementia. here with:  1. Memory disturbance  The patient's memory score has remained stable. She'll continue on Aricept 10 mg  at bedtime. I did discuss with the patient's daughter about visiting a medical supply company to see if they have any devices that can help with transfers other than lifts. She voiced understanding. Patient will follow-up in 6 months or sooner if needed.  I spent 15 minutes with the patient 50% of this time was spent discussing the patient's memory score and assistance with transfers.    Butch PennyMegan Nikolaos Maddocks, MSN, NP-C 08/16/2016, 1:09 PM [facility] [address], [address] [place], [place] [address] [phone]

## 2016-08-16 NOTE — Progress Notes (Signed)
I have read the note, and I agree with the clinical assessment and plan.  WILLIS,CHARLES KEITH   

## 2016-08-31 ENCOUNTER — Ambulatory Visit (INDEPENDENT_AMBULATORY_CARE_PROVIDER_SITE_OTHER): Payer: Medicare Other | Admitting: Podiatry

## 2016-08-31 ENCOUNTER — Encounter: Payer: Self-pay | Admitting: Podiatry

## 2016-08-31 VITALS — BP 153/63 | HR 51 | Resp 18

## 2016-08-31 DIAGNOSIS — M79676 Pain in unspecified toe(s): Secondary | ICD-10-CM

## 2016-08-31 DIAGNOSIS — B351 Tinea unguium: Secondary | ICD-10-CM | POA: Diagnosis not present

## 2016-08-31 NOTE — Progress Notes (Signed)
Patient ID: [name], female   DOB: 14-Jun-1926, 83 y.o.   MRN: [id number]    Subjective: This patient presents today and the patient's daughter says that her mother is complaining of thickened and elongated toenails and requests toenail debridement The patient's daughter is present in the treatment room today  Objective: Patient is seated in a wheelchair and   able to transfer Patient confused DP pulse 1/4 right 2/4 left PT pulses 1/4 bilaterally HAV bilaterally Patient not able to respond to 10 g monofilament wire or tuning fork Hammertoe 2-5 bilaterally No open skin lesions bilaterally Trophic skin bilaterally The toenails are incurvated, discolored with dystrophic changes and tender direct palpation 6-10   Assessment: Symptomatic onychomycoses 6-10  Plan: Debridement toenails 6-10 mechanically and electronically without any bleeding  Reappoint 3 month

## 2016-11-30 ENCOUNTER — Ambulatory Visit: Payer: Medicare Other | Admitting: Podiatry

## 2016-12-28 ENCOUNTER — Ambulatory Visit (INDEPENDENT_AMBULATORY_CARE_PROVIDER_SITE_OTHER): Payer: Medicare Other | Admitting: Podiatry

## 2016-12-28 ENCOUNTER — Encounter: Payer: Self-pay | Admitting: Podiatry

## 2016-12-28 DIAGNOSIS — M79676 Pain in unspecified toe(s): Secondary | ICD-10-CM

## 2016-12-28 DIAGNOSIS — B351 Tinea unguium: Secondary | ICD-10-CM | POA: Diagnosis not present

## 2016-12-28 NOTE — Progress Notes (Signed)
Patient ID: [name], female   DOB: October 10, 1925, 83 y.o.   MRN: [id number]    Subjective: This patient presents todayand the patient's daughter says that her mother iscomplaining of thickened and elongated toenails and requests toenail debridement The patient's daughter and caregiver are  present in the treatment room today  Objective: Patient is seated in a wheelchair and  able to transfer Patient confused DP pulse 1/4 right 2/4 left PT pulses 1/4 bilaterally HAV bilaterally Patient not able to respond to 10 g monofilament wire or tuning fork Hammertoe 2-5 bilaterally No open skin lesions bilaterally Trophic skin bilaterally The toenails are incurvated, discolored with dystrophic changes and tender direct palpation 6-10   Assessment: Symptomatic onychomycoses 6-10  Plan: Debridement toenails 6-10 mechanically and electronically without any bleeding  Reappoint 3 month

## 2017-02-16 ENCOUNTER — Ambulatory Visit (INDEPENDENT_AMBULATORY_CARE_PROVIDER_SITE_OTHER): Payer: PRIVATE HEALTH INSURANCE | Admitting: Neurology

## 2017-02-16 ENCOUNTER — Encounter: Payer: Self-pay | Admitting: Neurology

## 2017-02-16 VITALS — BP 130/68 | HR 59 | Ht 59.0 in

## 2017-02-16 DIAGNOSIS — G301 Alzheimer's disease with late onset: Secondary | ICD-10-CM

## 2017-02-16 DIAGNOSIS — F0280 Dementia in other diseases classified elsewhere without behavioral disturbance: Secondary | ICD-10-CM | POA: Diagnosis not present

## 2017-02-16 DIAGNOSIS — Z5181 Encounter for therapeutic drug level monitoring: Secondary | ICD-10-CM

## 2017-02-16 DIAGNOSIS — E538 Deficiency of other specified B group vitamins: Secondary | ICD-10-CM | POA: Diagnosis not present

## 2017-02-16 MED ORDER — BUPROPION HCL ER (XL) 150 MG PO TB24: 300.0000 mg | ORAL_TABLET | Freq: Every day | ORAL | 3 refills | Status: AC

## 2017-02-16 NOTE — Patient Instructions (Signed)
   Go to 5 mg of aricept for 2 weeks, then stop.  We will go up on the Wellbutrin to 300 mg a day.

## 2017-02-16 NOTE — Progress Notes (Signed)
Reason for visit: Alzheimer's disease  [name] is an 83 y.o. female  History of present illness:  [name] is an 83 year old right-handed white female with a history of a progressive memory disorder. The patient was seen in February 2018, her Mini-Mental Status Examination score was 20 out of 30 at that time. The patient has had a significant decline that occurred over several days in May 2018. She has had a significant increase in confusion. She is not agitated, but she requires assistance with feeding, she has decreased verbal output. She appears to be sleeping well at night. She has had a lot of problems over many years with significant reflux troubles secondary to a significant hiatal hernia. She is also on Lasix as a diuretic. The family claims that she has not had any blood work or urine studies done since the confusion started. The patient is scoring 8/30 on a Mini-Mental status examination today. In 2016, she demonstrated wide variations in her Mini-Mental Status Examination, she scored a 20/30 earlier in 2016, later on she scored a 9/30. She then recovered back to 20/30. The patient has had a history depression, she is on Wellbutrin currently. Hospice has been called in to see the patient.  Past Medical History:  Diagnosis Date  . Acute systolic heart failure (HCC)   . Anemia    history  . Anxiety   . Aortic regurgitation   . Arthritis    "probably all over" (06/25/2013)  . CHF (congestive heart failure) (HCC) 2012  . Degenerative joint disease    history  . Delirium, acute   . Depression   . GERD (gastroesophageal reflux disease)   . H/O hiatal hernia   . Hypertension   . Hypoxia    resolved  . Muscular deconditioning   . TIA (transient ischemic attack)    "multiple" (06/25/2013)  . Vascular dementia     Past Surgical History:  Procedure Laterality Date  . CARPAL TUNNEL RELEASE Bilateral 1980's  . CATARACT EXTRACTION W/ INTRAOCULAR LENS  IMPLANT, BILATERAL  Bilateral 1990's  . CHOLECYSTECTOMY  1990's  . TOTAL KNEE ARTHROPLASTY Left 2001  . TUBAL LIGATION  1962    Family History  Problem Relation Age of Onset  . Heart attack Mother   . Ovarian cancer Daughter   . Prostate cancer Brother        mets to bones  . Breast cancer Sister     Social history:  reports that she has never smoked. She has never used smokeless tobacco. She reports that she does not drink alcohol or use drugs.    Allergies  Allergen Reactions  . Dilantin [Phenytoin Sodium Extended]   . Phenytoin Sodium Extended Itching  . Tegretol [Carbamazepine] Itching    itching    Medications:  Prior to Admission medications   Medication Sig Start Date End Date Taking? Authorizing Provider  buPROPion (WELLBUTRIN XL) 150 MG 24 hr tablet TAKE ONE (1) TABLET BY MOUTH EVERY DAY 08/17/15  Yes York Spaniel, MD  donepezil (ARICEPT) 10 MG tablet TAKE ONE (1) TABLET BY MOUTH EVERY DAY 02/23/16  Yes York Spaniel, MD  ferrous sulfate 325 (65 FE) MG tablet Take 325 mg by mouth daily with breakfast.   Yes [provider]  furosemide (LASIX) 40 MG tablet Take 40 mg by mouth daily.    Yes [provider]  metoprolol succinate (TOPROL-XL) 25 MG 24 hr tablet Take 12.5 mg by mouth daily.    Yes [provider]  omeprazole (PRILOSEC) 10 MG capsule Take 10 mg by mouth daily.   Yes [provider]  potassium chloride SA (K-DUR,KLOR-CON) 20 MEQ tablet Take 20 mEq by mouth daily.     Yes [provider]  senna (SENOKOT) 8.6 MG tablet Take 1 tablet by mouth daily.   Yes [provider]    ROS:  Out of a complete 14 system review of symptoms, the patient complains only of the following symptoms, and all other reviewed systems are negative.  Hearing loss Abdominal pain, vomiting, incontinence of the bowels Incontinence of the bladder Memory loss, weakness  Blood pressure 130/68, pulse (!) 59, height 4\' 11"  (1.499 m).  Physical  Exam  General: The patient is alert and cooperative at the time of the examination.  Skin: No significant peripheral edema is noted.   Neurologic Exam  Mental status: The patient is alert and oriented x 1 at the time of the examination (oriented to name). The Mini-Mental Status Examination done today shows a total score 8/30.   Cranial nerves: Facial symmetry is present. Speech is normal, no aphasia or dysarthria is noted. Extraocular movements are full. Visual fields are full to threat.  Motor: The patient has good strength in all 4 extremities.  Sensory examination: Soft touch sensation is symmetric on the face, arms, and legs.  Coordination: The patient has good finger-nose-finger bilaterally. The patient is unable to perform heel-to-shin well on either side.  Gait and station: The patient is nonambulatory, she is wheelchair-bound.  Reflexes: Deep tendon reflexes are symmetric.   Assessment/Plan:  1. Alzheimer's disease  2. Gait disturbance, nonambulatory state  3. Sudden significant change in mental status  4. History of depression  The patient in the past has demonstrated wide variations in her mental status, she was scoring 20/30 on the Mini-Mental Status Examination in February 2018, she now scoring 8/30. The patient will be sent for blood work today, the Wellbutrin dose will be increased, as it may be possible that depression may be worsening her mental status. The patient has significant gastroesophageal reflux disease associated with a hiatal hernia, the Aricept is likely exacerbating this. The patient will go down on the Aricept to 5 mg at night for 2 weeks and then stop the drug. The patient will follow-up in 6 months. Hospice is now involved with the care of this patient.  Marlan Palau. Keith Willis MD 02/16/2017 2:51 PM  [facility] [address] [place] [id number]  Phone [phone] Fax [phone]

## 2017-02-20 ENCOUNTER — Telehealth: Payer: Self-pay | Admitting: Neurology

## 2017-02-20 LAB — CBC WITH DIFFERENTIAL/PLATELET
BASOS ABS: 0 10*3/uL (ref 0.0–0.2)
Basos: 1 %
EOS (ABSOLUTE): 0.2 10*3/uL (ref 0.0–0.4)
EOS: 3 %
HEMATOCRIT: 38.8 % (ref 34.0–46.6)
Hemoglobin: 13 g/dL (ref 11.1–15.9)
Immature Grans (Abs): 0 10*3/uL (ref 0.0–0.1)
Immature Granulocytes: 0 %
LYMPHS ABS: 0.8 10*3/uL (ref 0.7–3.1)
Lymphs: 15 %
MCH: 29.2 pg (ref 26.6–33.0)
MCHC: 33.5 g/dL (ref 31.5–35.7)
MCV: 87 fL (ref 79–97)
Monocytes Absolute: 0.4 10*3/uL (ref 0.1–0.9)
Monocytes: 8 %
NEUTROS PCT: 73 %
Neutrophils Absolute: 3.8 10*3/uL (ref 1.4–7.0)
Platelets: 231 10*3/uL (ref 150–379)
RBC: 4.45 x10E6/uL (ref 3.77–5.28)
RDW: 15 % (ref 12.3–15.4)
WBC: 5.1 10*3/uL (ref 3.4–10.8)

## 2017-02-20 LAB — COMPREHENSIVE METABOLIC PANEL
ALK PHOS: 64 IU/L (ref 39–117)
ALT: 9 IU/L (ref 0–32)
AST: 23 IU/L (ref 0–40)
Albumin/Globulin Ratio: 1.6 (ref 1.2–2.2)
Albumin: 4.1 g/dL (ref 3.5–4.7)
BILIRUBIN TOTAL: 0.5 mg/dL (ref 0.0–1.2)
BUN/Creatinine Ratio: 14 (ref 12–28)
BUN: 11 mg/dL (ref 8–27)
CHLORIDE: 98 mmol/L (ref 96–106)
CO2: 26 mmol/L (ref 20–29)
CREATININE: 0.78 mg/dL (ref 0.57–1.00)
Calcium: 8.6 mg/dL — ABNORMAL LOW (ref 8.7–10.3)
GFR calc Af Amer: 78 mL/min/{1.73_m2} (ref >59)
GFR calc non Af Amer: 68 mL/min/{1.73_m2} (ref >59)
GLUCOSE: 70 mg/dL (ref 65–99)
Globulin, Total: 2.6 g/dL (ref 1.5–4.5)
Potassium: 4 mmol/L (ref 3.5–5.2)
Sodium: 140 mmol/L (ref 134–144)
Total Protein: 6.7 g/dL (ref 6.0–8.5)

## 2017-02-20 LAB — AMMONIA: AMMONIA: 35 ug/dL (ref 19–87)

## 2017-02-20 LAB — VITAMIN B1: THIAMINE: 79.9 nmol/L (ref 66.5–200.0)

## 2017-02-20 LAB — VITAMIN B12: Vitamin B-12: 211 pg/mL — ABNORMAL LOW (ref 232–1245)

## 2017-02-20 NOTE — Telephone Encounter (Signed)
I called the patient, talk with the daughter. The blood work shows a minimally low calcium level VIII.6, the vitamin B12 level was also low, they need to get on oral supplementation taking 1000 g daily of vitamin B12.

## 2017-05-03 ENCOUNTER — Ambulatory Visit: Payer: Medicare Other | Admitting: Podiatry

## 2017-05-22 ENCOUNTER — Ambulatory Visit: Payer: PRIVATE HEALTH INSURANCE | Admitting: Podiatry

## 2017-08-31 ENCOUNTER — Ambulatory Visit: Payer: PRIVATE HEALTH INSURANCE | Admitting: Neurology

## 2018-10-03 DEATH — deceased
